# Patient Record
Sex: Female | Born: 1965
Health system: Southern US, Community
[De-identification: ages and names within clinical notes are randomized; demographics above are authoritative.]

## PROBLEM LIST (undated history)

## (undated) DIAGNOSIS — K219 Gastro-esophageal reflux disease without esophagitis: Secondary | ICD-10-CM

## (undated) DIAGNOSIS — I10 Essential (primary) hypertension: Secondary | ICD-10-CM

## (undated) HISTORY — PX: EYE SURGERY: SHX253

## (undated) HISTORY — DX: Essential (primary) hypertension: I10

## (undated) HISTORY — DX: Gastro-esophageal reflux disease without esophagitis: K21.9

---

## 2007-10-06 ENCOUNTER — Ambulatory Visit: Payer: Self-pay | Admitting: Obstetrics and Gynecology

## 2014-02-27 ENCOUNTER — Emergency Department (HOSPITAL_COMMUNITY)
Admission: EM | Admit: 2014-02-27 | Discharge: 2014-02-28 | Disposition: A | Discharge status: Home / Self Care | Payer: No Typology Code available for payment source | Attending: Emergency Medicine | Admitting: Emergency Medicine

## 2014-02-27 ENCOUNTER — Emergency Department (HOSPITAL_COMMUNITY): Payer: No Typology Code available for payment source

## 2014-02-27 ENCOUNTER — Encounter (HOSPITAL_COMMUNITY): Payer: Self-pay

## 2014-02-27 DIAGNOSIS — S63502A Unspecified sprain of left wrist, initial encounter: Secondary | ICD-10-CM | POA: Insufficient documentation

## 2014-02-27 DIAGNOSIS — Z23 Encounter for immunization: Secondary | ICD-10-CM | POA: Insufficient documentation

## 2014-02-27 DIAGNOSIS — S9031XA Contusion of right foot, initial encounter: Secondary | ICD-10-CM | POA: Insufficient documentation

## 2014-02-27 DIAGNOSIS — Y9389 Activity, other specified: Secondary | ICD-10-CM | POA: Diagnosis not present

## 2014-02-27 DIAGNOSIS — S63501A Unspecified sprain of right wrist, initial encounter: Secondary | ICD-10-CM | POA: Insufficient documentation

## 2014-02-27 DIAGNOSIS — Y998 Other external cause status: Secondary | ICD-10-CM | POA: Insufficient documentation

## 2014-02-27 DIAGNOSIS — Y92410 Unspecified street and highway as the place of occurrence of the external cause: Secondary | ICD-10-CM | POA: Insufficient documentation

## 2014-02-27 DIAGNOSIS — S81812A Laceration without foreign body, left lower leg, initial encounter: Secondary | ICD-10-CM | POA: Diagnosis present

## 2014-02-27 MED ORDER — TETANUS-DIPHTH-ACELL PERTUSSIS 5-2.5-18.5 LF-MCG/0.5 IM SUSP
INTRAMUSCULAR | Status: AC
  Filled 2014-02-27: qty 0.5

## 2014-02-27 MED ORDER — ONDANSETRON 4 MG PO TBDP
4.0000 mg | ORAL_TABLET | Freq: Once | ORAL | Status: AC
  Administered 2014-02-27: 4 mg via ORAL
  Filled 2014-02-27: qty 1

## 2014-02-27 MED ORDER — LIDOCAINE-EPINEPHRINE 1 %-1:100000 IJ SOLN
20.0000 mL | Freq: Once | INTRAMUSCULAR | Status: AC
  Administered 2014-02-28: 20 mL
  Filled 2014-02-27: qty 1

## 2014-02-27 MED ORDER — LIDOCAINE HCL (PF) 1 % IJ SOLN: 30.0000 mL | Freq: Once | INTRAMUSCULAR | Status: DC

## 2014-02-27 MED ORDER — TETANUS-DIPHTH-ACELL PERTUSSIS 5-2.5-18.5 LF-MCG/0.5 IM SUSP
0.5000 mL | Freq: Once | INTRAMUSCULAR | Status: AC
  Administered 2014-02-27: 0.5 mL via INTRAMUSCULAR

## 2014-02-27 MED ORDER — HYDROCODONE-ACETAMINOPHEN 5-325 MG PO TABS
2.0000 | ORAL_TABLET | Freq: Once | ORAL | Status: AC
  Administered 2014-02-27: 2 via ORAL
  Filled 2014-02-27: qty 2
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
## 2014-02-28 MED ORDER — IBUPROFEN 800 MG PO TABS: 800.0000 mg | ORAL_TABLET | Freq: Three times a day (TID) | ORAL | Status: DC

## 2014-02-28 MED ORDER — ORPHENADRINE CITRATE ER 100 MG PO TB12: 100.0000 mg | ORAL_TABLET | Freq: Two times a day (BID) | ORAL | Status: DC

## 2014-02-28 MED ORDER — HYDROCODONE-ACETAMINOPHEN 5-325 MG PO TABS: 1.0000 | ORAL_TABLET | ORAL | Status: DC | PRN
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
## 2014-02-28 NOTE — ED Notes (Signed)
Dr. Pfeiffer at bedside   

## 2014-03-15 ENCOUNTER — Ambulatory Visit: Payer: Self-pay | Admitting: Podiatry

## 2014-11-14 ENCOUNTER — Other Ambulatory Visit: Payer: Self-pay | Admitting: Physician Assistant

## 2014-11-14 DIAGNOSIS — R1310 Dysphagia, unspecified: Secondary | ICD-10-CM

## 2014-11-28 ENCOUNTER — Ambulatory Visit
Admission: RE | Admit: 2014-11-28 | Discharge: 2014-11-28 | Disposition: A | Discharge status: Home / Self Care | Payer: No Typology Code available for payment source | Source: Ambulatory Visit | Attending: Physician Assistant | Admitting: Physician Assistant

## 2014-11-28 DIAGNOSIS — R1310 Dysphagia, unspecified: Secondary | ICD-10-CM

## 2014-11-28 DIAGNOSIS — R1314 Dysphagia, pharyngoesophageal phase: Secondary | ICD-10-CM | POA: Diagnosis present

## 2014-11-28 DIAGNOSIS — K2980 Duodenitis without bleeding: Secondary | ICD-10-CM | POA: Insufficient documentation
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
## 2017-01-26 ENCOUNTER — Encounter: Payer: Self-pay | Admitting: Nurse Practitioner

## 2017-02-24 ENCOUNTER — Other Ambulatory Visit: Payer: Self-pay

## 2017-02-24 MED ORDER — PANTOPRAZOLE SODIUM 40 MG PO TBEC: 40.0000 mg | DELAYED_RELEASE_TABLET | Freq: Every day | ORAL | 1 refills | Status: DC

## 2017-03-26 ENCOUNTER — Other Ambulatory Visit: Payer: Self-pay

## 2017-03-26 MED ORDER — TRI-LO-MARZIA 0.18/0.215/0.25 MG-25 MCG PO TABS: ORAL_TABLET | ORAL | 1 refills | Status: DC

## 2017-04-22 ENCOUNTER — Telehealth: Payer: Self-pay

## 2017-04-23 NOTE — Telephone Encounter (Signed)
ERROR/CLC 

## 2017-05-14 ENCOUNTER — Other Ambulatory Visit: Payer: Self-pay

## 2017-05-14 MED ORDER — TRI-LO-MARZIA 0.18/0.215/0.25 MG-25 MCG PO TABS: ORAL_TABLET | ORAL | 0 refills | Status: DC

## 2017-06-11 ENCOUNTER — Ambulatory Visit: Payer: Self-pay | Admitting: Nurse Practitioner

## 2017-06-11 ENCOUNTER — Encounter: Payer: Self-pay | Admitting: Nurse Practitioner

## 2017-06-11 VITALS — BP 136/92 | HR 83 | Resp 16 | Ht 59.0 in | Wt 195.0 lb

## 2017-06-11 DIAGNOSIS — R300 Dysuria: Secondary | ICD-10-CM

## 2017-06-11 DIAGNOSIS — Z0001 Encounter for general adult medical examination with abnormal findings: Secondary | ICD-10-CM

## 2017-06-11 DIAGNOSIS — Z1231 Encounter for screening mammogram for malignant neoplasm of breast: Secondary | ICD-10-CM

## 2017-06-11 DIAGNOSIS — R635 Abnormal weight gain: Secondary | ICD-10-CM

## 2017-06-11 DIAGNOSIS — E559 Vitamin D deficiency, unspecified: Secondary | ICD-10-CM

## 2017-06-11 DIAGNOSIS — R51 Headache: Secondary | ICD-10-CM | POA: Diagnosis not present

## 2017-06-11 DIAGNOSIS — R519 Headache, unspecified: Secondary | ICD-10-CM

## 2017-06-11 DIAGNOSIS — Z1239 Encounter for other screening for malignant neoplasm of breast: Secondary | ICD-10-CM

## 2017-06-11 MED ORDER — IBUPROFEN 600 MG PO TABS: 600.0000 mg | ORAL_TABLET | Freq: Three times a day (TID) | ORAL | 1 refills | Status: DC | PRN
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
## 2017-06-12 LAB — URINALYSIS, ROUTINE W REFLEX MICROSCOPIC
Bilirubin, UA: NEGATIVE
Glucose, UA: NEGATIVE
LEUKOCYTES UA: NEGATIVE
Nitrite, UA: NEGATIVE
Protein, UA: NEGATIVE
RBC, UA: NEGATIVE
Specific Gravity, UA: >=1.03 — AB (ref 1.005–1.030)
Urobilinogen, Ur: 1 mg/dL (ref 0.2–1.0)
pH, UA: 6.5 (ref 5.0–7.5)

## 2017-06-17 LAB — CBC WITH DIFFERENTIAL/PLATELET
BASOS: 0 %
Basophils Absolute: 0 10*3/uL (ref 0.0–0.2)
EOS (ABSOLUTE): 0.3 10*3/uL (ref 0.0–0.4)
Eos: 4 %
HEMOGLOBIN: 12.8 g/dL (ref 11.1–15.9)
Hematocrit: 38.1 % (ref 34.0–46.6)
IMMATURE GRANS (ABS): 0 10*3/uL (ref 0.0–0.1)
IMMATURE GRANULOCYTES: 0 %
LYMPHS: 16 %
Lymphocytes Absolute: 1.2 10*3/uL (ref 0.7–3.1)
MCH: 30 pg (ref 26.6–33.0)
MCHC: 33.6 g/dL (ref 31.5–35.7)
MCV: 89 fL (ref 79–97)
Monocytes Absolute: 0.5 10*3/uL (ref 0.1–0.9)
Monocytes: 7 %
Neutrophils Absolute: 5.5 10*3/uL (ref 1.4–7.0)
Neutrophils: 73 %
Platelets: 275 10*3/uL (ref 150–379)
RBC: 4.27 x10E6/uL (ref 3.77–5.28)
RDW: 13.5 % (ref 12.3–15.4)
WBC: 7.6 10*3/uL (ref 3.4–10.8)

## 2017-06-17 LAB — COMPREHENSIVE METABOLIC PANEL
ALBUMIN: 4 g/dL (ref 3.5–5.5)
ALT: 22 IU/L (ref 0–32)
AST: 20 IU/L (ref 0–40)
Albumin/Globulin Ratio: 1.7 (ref 1.2–2.2)
Alkaline Phosphatase: 45 IU/L (ref 39–117)
BILIRUBIN TOTAL: 0.5 mg/dL (ref 0.0–1.2)
BUN / CREAT RATIO: 10 (ref 9–23)
BUN: 7 mg/dL (ref 6–24)
CALCIUM: 9.2 mg/dL (ref 8.7–10.2)
CO2: 23 mmol/L (ref 20–29)
Chloride: 106 mmol/L (ref 96–106)
Creatinine, Ser: 0.7 mg/dL (ref 0.57–1.00)
GFR, EST AFRICAN AMERICAN: 114 mL/min/{1.73_m2} (ref >59)
GFR, EST NON AFRICAN AMERICAN: 99 mL/min/{1.73_m2} (ref >59)
Globulin, Total: 2.3 g/dL (ref 1.5–4.5)
Glucose: 84 mg/dL (ref 65–99)
POTASSIUM: 4.5 mmol/L (ref 3.5–5.2)
SODIUM: 141 mmol/L (ref 134–144)
Total Protein: 6.3 g/dL (ref 6.0–8.5)

## 2017-06-17 LAB — VITAMIN D 1,25 DIHYDROXY
VITAMIN D 1, 25 (OH) TOTAL: 74 pg/mL — AB
Vitamin D2 1, 25 (OH)2: <10 pg/mL
Vitamin D3 1, 25 (OH)2: 74 pg/mL

## 2017-06-17 LAB — T4, FREE: Free T4: 1.29 ng/dL (ref 0.82–1.77)

## 2017-06-17 LAB — TSH: TSH: 1.44 u[IU]/mL (ref 0.450–4.500)

## 2017-06-17 LAB — LIPID PANEL
CHOL/HDL RATIO: 3.2 ratio (ref 0.0–4.4)
CHOLESTEROL TOTAL: 209 mg/dL — AB (ref 100–199)
HDL: 65 mg/dL (ref >39)
LDL CALC: 123 mg/dL — AB (ref 0–99)
Triglycerides: 106 mg/dL (ref 0–149)
VLDL CHOLESTEROL CAL: 21 mg/dL (ref 5–40)

## 2017-06-19 ENCOUNTER — Other Ambulatory Visit: Payer: Self-pay

## 2017-06-19 MED ORDER — TRI-LO-MARZIA 0.18/0.215/0.25 MG-25 MCG PO TABS: ORAL_TABLET | ORAL | 0 refills | Status: DC

## 2017-06-30 ENCOUNTER — Ambulatory Visit: Payer: PRIVATE HEALTH INSURANCE | Admitting: Nurse Practitioner

## 2017-06-30 ENCOUNTER — Encounter: Payer: Self-pay | Admitting: Nurse Practitioner

## 2017-06-30 VITALS — BP 144/86 | HR 74 | Resp 16 | Ht 59.0 in | Wt 193.0 lb

## 2017-06-30 DIAGNOSIS — R030 Elevated blood-pressure reading, without diagnosis of hypertension: Secondary | ICD-10-CM | POA: Diagnosis not present

## 2017-06-30 DIAGNOSIS — Z6838 Body mass index (BMI) 38.0-38.9, adult: Secondary | ICD-10-CM | POA: Diagnosis not present

## 2017-06-30 DIAGNOSIS — R635 Abnormal weight gain: Secondary | ICD-10-CM | POA: Diagnosis not present

## 2017-06-30 MED ORDER — PHENTERMINE HCL 37.5 MG PO TABS: 37.5000 mg | ORAL_TABLET | Freq: Every day | ORAL | 1 refills | Status: DC

## 2017-06-30 NOTE — Progress Notes (Signed)
[facility] [address] [place], [place] [address]  Internal MEDICINE  Office Visit Note  Patient Name: Lauren Marsh  [id number]  [id number]  Date of Service: 06/30/2017   Pt is here for routine follow up  Chief Complaint  Patient presents with  . Weight Loss  . Follow-up    labs     The patient would like to start medically managed weight loss program. Labs were drawn and thyroid panel was normal. She has started exercising. She is waling 15 minutes twice daily, Walking about 1 mile per day. She is also watching and carefully monitoring her diet. Has never been on appetite suppressant.    .    Current Medication: Outpatient Encounter Medications as of 06/30/2017  Medication Sig  . acetaminophen (TYLENOL) 500 MG tablet Take by mouth.  Marland Kitchen ibuprofen (ADVIL,MOTRIN) 200 MG tablet Take by mouth.  Marland Kitchen ibuprofen (ADVIL,MOTRIN) 600 MG tablet Take 1 tablet (600 mg total) by mouth every 8 (eight) hours as needed for headache.  . naproxen sodium (ANAPROX) 550 MG tablet Take 550 mg by mouth 2 (two) times daily with a meal.  . pantoprazole (PROTONIX) 40 MG tablet Take 1 tablet (40 mg total) by mouth daily. Take 1 tablet by mouth daily every morning  . phentermine (ADIPEX-P) 37.5 MG tablet Take 1 tablet (37.5 mg total) by mouth daily before breakfast.  . TRI-LO-MARZIA 0.18/0.215/0.25 MG-25 MCG tab Use as directed for 28 days   No facility-administered encounter medications on file as of 06/30/2017.     Surgical History: Past Surgical History:  Procedure Laterality Date  . EYE SURGERY      Medical History: Past Medical History:  Diagnosis Date  . GERD (gastroesophageal reflux disease)     Family History: Family History  Problem Relation Age of Onset  . Hypertension Mother   . Hypertension Father     Social History   Socioeconomic History  . Marital status: Married    Spouse name: Not on file  . Number of children: Not on file  . Years of education: Not  on file  . Highest education level: Not on file  Occupational History  . Not on file  Social Needs  . Financial resource strain: Not on file  . Food insecurity:    Worry: Not on file    Inability: Not on file  . Transportation needs:    Medical: Not on file    Non-medical: Not on file  Tobacco Use  . Smoking status: Never Smoker  . Smokeless tobacco: Never Used  Substance and Sexual Activity  . Alcohol use: No  . Drug use: Never  . Sexual activity: Not on file  Lifestyle  . Physical activity:    Days per week: Not on file    Minutes per session: Not on file  . Stress: Not on file  Relationships  . Social connections:    Talks on phone: Not on file    Gets together: Not on file    Attends religious service: Not on file    Active member of club or organization: Not on file    Attends meetings of clubs or organizations: Not on file    Relationship status: Not on file  . Intimate partner violence:    Fear of current or ex partner: Not on file    Emotionally abused: Not on file    Physically abused: Not on file    Forced sexual activity: Not on file  Other Topics Concern  .  Not on file  Social History Narrative  . Not on file      Review of Systems  Constitutional: Negative for activity change, chills, fatigue and unexpected weight change.       Weight loss of  2 pounds since msot recent visit.   HENT: Negative for congestion, postnasal drip, rhinorrhea, sneezing and sore throat.   Eyes: Negative.  Negative for redness.  Respiratory: Negative for cough, chest tightness, shortness of breath and wheezing.   Cardiovascular: Negative for chest pain and palpitations.  Gastrointestinal: Negative for abdominal pain, constipation, diarrhea, nausea and vomiting.  Endocrine: Negative for cold intolerance, heat intolerance, polydipsia, polyphagia and polyuria.  Genitourinary: Negative for dysuria and frequency.  Musculoskeletal: Negative for arthralgias, back pain, joint  swelling and neck pain.  Skin: Negative for rash.  Allergic/Immunologic: Negative for environmental allergies.  Neurological: Negative.  Negative for tremors and numbness.  Hematological: Negative for adenopathy. Does not bruise/bleed easily.  Psychiatric/Behavioral: Negative for behavioral problems (Depression), sleep disturbance and suicidal ideas. The patient is not nervous/anxious.     Today's Vitals   06/30/17 1401  BP: (!) 144/86  Pulse: 74  Resp: 16  SpO2: 98%  Weight: 193 lb (87.5 kg)  Height:  (1.499 m)    Physical Exam  Constitutional: She is oriented to person, place, and time. She appears well-developed and well-nourished. No distress.  HENT:  Head: Normocephalic and atraumatic.  Mouth/Throat: Oropharynx is clear and moist. No oropharyngeal exudate.  Eyes: Pupils are equal, round, and reactive to light. Conjunctivae and EOM are normal.  Neck: Normal range of motion. Neck supple. No JVD present. No tracheal deviation present. No thyromegaly present.  Cardiovascular: Normal rate, regular rhythm and normal heart sounds. Exam reveals no gallop and no friction rub.  No murmur heard. Pulmonary/Chest: Effort normal and breath sounds normal. No respiratory distress. She has no wheezes. She has no rales. She exhibits no tenderness.  Abdominal: Soft. Bowel sounds are normal. There is no tenderness.  Musculoskeletal: Normal range of motion.  Lymphadenopathy:    She has no cervical adenopathy.  Neurological: She is alert and oriented to person, place, and time. No cranial nerve deficit.  Skin: Skin is warm and dry. She is not diaphoretic.  Psychiatric: She has a normal mood and affect. Her behavior is normal. Judgment and thought content normal.  Nursing note and vitals reviewed.   Assessment/Plan: 1. Body mass index (bmi) 38.0-38.9, adult - Metabolic Test indicates slightly slower metabolism than normal. Start phentermine every day. Sample menus for 1500 and 1600  calorie diets were given continue with increased exercise.  - phentermine (ADIPEX-P) 37.5 MG tablet; Take 1 tablet (37.5 mg total) by mouth daily before breakfast.  Dispense: 30 tablet; Refill: 1  2. Abnormal weight gain Reviewed labs which were normal. Weight loss program as described. Follow up 4 weeks.   3. Elevated blood pressure reading Increase water and reduce intake of salt. Increase exercise. Continue to monitor closely   General Counseling: kumiko fishman understanding of the findings of todays visit and agrees with plan of treatment. I have discussed any further diagnostic evaluation that may be needed or ordered today. We also reviewed her medications today. she has been encouraged to call the office with any questions or concerns that should arise related to todays visit.   There is a liability release in patients' chart. There has been a 10 minute discussion about the side effects including but not limited to elevated blood pressure, anxiety, lack of  sleep and dry mouth. Pt understands and will like to start/continue on appetite suppressant at this time. There will be one month RX given at the time of visit with proper follow up. Nova diet plan with restricted calories is given to the pt. Pt understands and agrees with  plan of treatment  This patient was seen by Vincent Gros, FNP- C in Collaboration with Dr Lyndon Code as a part of collaborative care agreement    Orders Placed This Encounter  Procedures  . Metabolic Test    Meds ordered this encounter  Medications  . phentermine (ADIPEX-P) 37.5 MG tablet    Sig: Take 1 tablet (37.5 mg total) by mouth daily before breakfast.    Dispense:  30 tablet    Refill:  1    Order Specific Question:   Supervising Provider    Answer:   Lyndon Code [[id number]]    Time spent: 31 Minutes     Dr Lyndon Code Internal medicine

## 2017-07-06 DIAGNOSIS — R519 Headache, unspecified: Secondary | ICD-10-CM | POA: Insufficient documentation

## 2017-07-06 DIAGNOSIS — Z0001 Encounter for general adult medical examination with abnormal findings: Secondary | ICD-10-CM | POA: Insufficient documentation

## 2017-07-06 DIAGNOSIS — R300 Dysuria: Secondary | ICD-10-CM | POA: Insufficient documentation

## 2017-07-06 DIAGNOSIS — R51 Headache: Secondary | ICD-10-CM

## 2017-07-06 DIAGNOSIS — Z1239 Encounter for other screening for malignant neoplasm of breast: Secondary | ICD-10-CM

## 2017-07-06 DIAGNOSIS — E559 Vitamin D deficiency, unspecified: Secondary | ICD-10-CM | POA: Insufficient documentation

## 2017-07-13 ENCOUNTER — Other Ambulatory Visit: Payer: Self-pay | Admitting: Nurse Practitioner

## 2017-07-13 MED ORDER — TRI-LO-MARZIA 0.18/0.215/0.25 MG-25 MCG PO TABS: ORAL_TABLET | ORAL | 12 refills | Status: DC

## 2017-07-28 ENCOUNTER — Encounter: Payer: Self-pay | Admitting: Nurse Practitioner

## 2017-07-28 ENCOUNTER — Ambulatory Visit: Payer: PRIVATE HEALTH INSURANCE | Admitting: Nurse Practitioner

## 2017-07-28 VITALS — BP 138/80 | HR 111 | Resp 16 | Ht 59.0 in | Wt 187.8 lb

## 2017-07-28 DIAGNOSIS — Z6838 Body mass index (BMI) 38.0-38.9, adult: Secondary | ICD-10-CM

## 2017-07-28 DIAGNOSIS — R030 Elevated blood-pressure reading, without diagnosis of hypertension: Secondary | ICD-10-CM | POA: Diagnosis not present

## 2017-07-28 MED ORDER — PHENTERMINE HCL 37.5 MG PO TABS: 37.5000 mg | ORAL_TABLET | Freq: Every day | ORAL | 1 refills | Status: DC

## 2017-07-28 NOTE — Progress Notes (Signed)
[facility] [address] [place], [place] [address]  Internal MEDICINE  Office Visit Note  Patient Name: Lauren Marsh  [id number]  [id number]  Date of Service: 08/19/2017     Pt is here for routine follow up.   Chief Complaint  Patient presents with  . Weight Loss    follow up - 6 pound weight loss since her most recent visit.     Patient has been on phentermine for past 6 weeks. Is doing well. Has lost 6 pounds since most recent visit. No negative side effects reported. Walking 2 miles per day, 5 miles per day. Increased energy. Definitely decreased appetite. Feels good.     Current Medication: Outpatient Encounter Medications as of 07/28/2017  Medication Sig  . acetaminophen (TYLENOL) 500 MG tablet Take by mouth.  Marland Kitchen. ibuprofen (ADVIL,MOTRIN) 200 MG tablet Take by mouth.  . naproxen sodium (ANAPROX) 550 MG tablet Take 550 mg by mouth 2 (two) times daily with a meal.  . phentermine (ADIPEX-P) 37.5 MG tablet Take 1 tablet (37.5 mg total) by mouth daily before breakfast.  . TRI-LO-MARZIA 0.18/0.215/0.25 MG-25 MCG tab Use as directed for 28 days  . [DISCONTINUED] ibuprofen (ADVIL,MOTRIN) 600 MG tablet Take 1 tablet (600 mg total) by mouth every 8 (eight) hours as needed for headache.  . [DISCONTINUED] pantoprazole (PROTONIX) 40 MG tablet Take 1 tablet (40 mg total) by mouth daily. Take 1 tablet by mouth daily every morning  . [DISCONTINUED] phentermine (ADIPEX-P) 37.5 MG tablet Take 1 tablet (37.5 mg total) by mouth daily before breakfast.   No facility-administered encounter medications on file as of 07/28/2017.     Surgical History: Past Surgical History:  Procedure Laterality Date  . EYE SURGERY      Medical History: Past Medical History:  Diagnosis Date  . GERD (gastroesophageal reflux disease)     Family History: Family History  Problem Relation Age of Onset  . Hypertension Mother   . Hypertension Father     Social History   Socioeconomic  History  . Marital status: Married    Spouse name: Not on file  . Number of children: Not on file  . Years of education: Not on file  . Highest education level: Not on file  Occupational History  . Not on file  Social Needs  . Financial resource strain: Not on file  . Food insecurity:    Worry: Not on file    Inability: Not on file  . Transportation needs:    Medical: Not on file    Non-medical: Not on file  Tobacco Use  . Smoking status: Never Smoker  . Smokeless tobacco: Never Used  Substance and Sexual Activity  . Alcohol use: No  . Drug use: Never  . Sexual activity: Not on file  Lifestyle  . Physical activity:    Days per week: Not on file    Minutes per session: Not on file  . Stress: Not on file  Relationships  . Social connections:    Talks on phone: Not on file    Gets together: Not on file    Attends religious service: Not on file    Active member of club or organization: Not on file    Attends meetings of clubs or organizations: Not on file    Relationship status: Not on file  . Intimate partner violence:    Fear of current or ex partner: Not on file    Emotionally abused: Not on file  Physically abused: Not on file    Forced sexual activity: Not on file  Other Topics Concern  . Not on file  Social History Narrative  . Not on file      Review of Systems  Constitutional: Negative for activity change, chills, fatigue and unexpected weight change.       Weight loss of 6 pounds since msot recent visit.   HENT: Negative for congestion, postnasal drip, rhinorrhea, sneezing and sore throat.   Eyes: Negative.  Negative for redness.  Respiratory: Negative for cough, chest tightness, shortness of breath and wheezing.   Cardiovascular: Negative for chest pain and palpitations.  Gastrointestinal: Negative for abdominal pain, constipation, diarrhea, nausea and vomiting.  Endocrine: Negative for cold intolerance, heat intolerance, polydipsia, polyphagia and  polyuria.  Genitourinary: Negative for dysuria and frequency.  Musculoskeletal: Negative for arthralgias, back pain, joint swelling and neck pain.  Skin: Negative for rash.  Allergic/Immunologic: Negative for environmental allergies.  Neurological: Negative for dizziness, tremors, numbness and headaches.  Hematological: Negative for adenopathy. Does not bruise/bleed easily.  Psychiatric/Behavioral: Negative for behavioral problems (Depression), sleep disturbance and suicidal ideas. The patient is not nervous/anxious.     Today's Vitals   07/28/17 1551  BP: 138/80  Pulse: (!) 111  Resp: 16  SpO2: 95%  Weight: 187 lb 12.8 oz (85.2 kg)  Height: 4\' 11"  (1.499 m)    Physical Exam  Constitutional: She is oriented to person, place, and time. She appears well-developed and well-nourished. No distress.  HENT:  Head: Normocephalic and atraumatic.  Mouth/Throat: Oropharynx is clear and moist. No oropharyngeal exudate.  Eyes: Pupils are equal, round, and reactive to light. Conjunctivae and EOM are normal.  Neck: Normal range of motion. Neck supple. No JVD present. No tracheal deviation present. No thyromegaly present.  Cardiovascular: Normal rate, regular rhythm and normal heart sounds. Exam reveals no gallop and no friction rub.  No murmur heard. Pulmonary/Chest: Effort normal and breath sounds normal. No respiratory distress. She has no wheezes. She has no rales. She exhibits no tenderness.  Abdominal: Soft. Bowel sounds are normal. There is no tenderness.  Musculoskeletal: Normal range of motion.  Lymphadenopathy:    She has no cervical adenopathy.  Neurological: She is alert and oriented to person, place, and time. No cranial nerve deficit.  Skin: Skin is warm and dry. She is not diaphoretic.  Psychiatric: She has a normal mood and affect. Her behavior is normal. Judgment and thought content normal.  Nursing note and vitals reviewed.   Assessment/Plan: 1. Elevated blood pressure  reading bp remains stable. Continue to control through diet and exercise   2. Body mass index (bmi) 38.0-38.9, adult Improving. Continue phentermine daiy. Limit calorie intake to 1200-1500 calories per day. Continue to participate in routine cardiovascular exercise.  - phentermine (ADIPEX-P) 37.5 MG tablet; Take 1 tablet (37.5 mg total) by mouth daily before breakfast.  Dispense: 30 tablet; Refill: 1  General Counseling: manuella blackson understanding of the findings of todays visit and agrees with plan of treatment. I have discussed any further diagnostic evaluation that may be needed or ordered today. We also reviewed her medications today. she has been encouraged to call the office with any questions or concerns that should arise related to todays visit.    Counseling:   There is a liability release in patients' chart. There has been a 10 minute discussion about the side effects including but not limited to elevated blood pressure, anxiety, lack of sleep and dry mouth. Pt understands  and will like to start/continue on appetite suppressant at this time. There will be one month RX given at the time of visit with proper follow up. Nova diet plan with restricted calories is given to the pt. Pt understands and agrees with  plan of treatment  This patient was seen by Vincent Gros, FNP- C in Collaboration with Dr Lyndon Code as a part of collaborative care agreement  Meds ordered this encounter  Medications  . phentermine (ADIPEX-P) 37.5 MG tablet    Sig: Take 1 tablet (37.5 mg total) by mouth daily before breakfast.    Dispense:  30 tablet    Refill:  1    Order Specific Question:   Supervising Provider    Answer:   Lyndon Code [[id number]]    Time spent: 68 Minutes          Dr Lyndon Code Internal medicine

## 2017-07-31 ENCOUNTER — Other Ambulatory Visit: Payer: Self-pay

## 2017-07-31 MED ORDER — PANTOPRAZOLE SODIUM 40 MG PO TBEC: 40.0000 mg | DELAYED_RELEASE_TABLET | Freq: Every day | ORAL | 1 refills | Status: DC

## 2017-08-10 ENCOUNTER — Other Ambulatory Visit: Payer: Self-pay | Admitting: Nurse Practitioner

## 2017-08-10 DIAGNOSIS — R51 Headache: Principal | ICD-10-CM

## 2017-08-10 DIAGNOSIS — R519 Headache, unspecified: Secondary | ICD-10-CM

## 2017-08-10 MED ORDER — IBUPROFEN 600 MG PO TABS: 600.0000 mg | ORAL_TABLET | Freq: Three times a day (TID) | ORAL | 1 refills | Status: DC | PRN

## 2017-09-04 ENCOUNTER — Encounter: Payer: Self-pay | Admitting: Adult Health

## 2017-09-04 ENCOUNTER — Other Ambulatory Visit: Payer: Self-pay

## 2017-09-04 ENCOUNTER — Ambulatory Visit: Payer: PRIVATE HEALTH INSURANCE | Admitting: Adult Health

## 2017-09-04 VITALS — BP 167/100 | HR 103 | Resp 16 | Ht 59.0 in | Wt 182.0 lb

## 2017-09-04 DIAGNOSIS — R030 Elevated blood-pressure reading, without diagnosis of hypertension: Secondary | ICD-10-CM

## 2017-09-04 DIAGNOSIS — Z6838 Body mass index (BMI) 38.0-38.9, adult: Secondary | ICD-10-CM

## 2017-09-04 MED ORDER — AMLODIPINE BESYLATE 5 MG PO TABS: 5.0000 mg | ORAL_TABLET | Freq: Every day | ORAL | 3 refills | Status: DC

## 2017-09-04 MED ORDER — AMLODIPINE BESYLATE 5 MG PO TABS: 5.0000 mg | ORAL_TABLET | Freq: Every day | ORAL | 0 refills | Status: DC

## 2017-09-04 NOTE — Progress Notes (Signed)
[facility] [address] [place], [place] [address]  Internal MEDICINE  Office Visit Note  Patient Name: Lauren Marsh  [id number] 02, 2067  [id number]  Date of Service: 09/04/2017  Chief Complaint  Patient presents with  . Medical Management of Chronic Issues    weight loss    HPI Pt here for follow up for weight loss medication.  Her blood pressure has been elevated at recent visits.  Today she continues to be elevated, and has a significant family history of HTN. She denies headaches, palpitation or chest pain.      Current Medication: Outpatient Encounter Medications as of 09/04/2017  Medication Sig  . acetaminophen (TYLENOL) 500 MG tablet Take by mouth.  Marland Kitchen. ibuprofen (ADVIL,MOTRIN) 200 MG tablet Take by mouth.  Marland Kitchen. ibuprofen (ADVIL,MOTRIN) 600 MG tablet Take 1 tablet (600 mg total) by mouth every 8 (eight) hours as needed for headache.  . naproxen sodium (ANAPROX) 550 MG tablet Take 550 mg by mouth 2 (two) times daily with a meal.  . pantoprazole (PROTONIX) 40 MG tablet Take 1 tablet (40 mg total) by mouth daily. Take 1 tablet by mouth daily every morning  . phentermine (ADIPEX-P) 37.5 MG tablet Take 1 tablet (37.5 mg total) by mouth daily before breakfast.  . TRI-LO-MARZIA 0.18/0.215/0.25 MG-25 MCG tab Use as directed for 28 days   No facility-administered encounter medications on file as of 09/04/2017.     Surgical History: Past Surgical History:  Procedure Laterality Date  . EYE SURGERY      Medical History: Past Medical History:  Diagnosis Date  . GERD (gastroesophageal reflux disease)     Family History: Family History  Problem Relation Age of Onset  . Hypertension Mother   . Hypertension Father     Social History   Socioeconomic History  . Marital status: Married    Spouse name: Not on file  . Number of children: Not on file  . Years of education: Not on file  . Highest education level: Not on file  Occupational History  . Not on file   Social Needs  . Financial resource strain: Not on file  . Food insecurity:    Worry: Not on file    Inability: Not on file  . Transportation needs:    Medical: Not on file    Non-medical: Not on file  Tobacco Use  . Smoking status: Never Smoker  . Smokeless tobacco: Never Used  Substance and Sexual Activity  . Alcohol use: No  . Drug use: Never  . Sexual activity: Not on file  Lifestyle  . Physical activity:    Days per week: Not on file    Minutes per session: Not on file  . Stress: Not on file  Relationships  . Social connections:    Talks on phone: Not on file    Gets together: Not on file    Attends religious service: Not on file    Active member of club or organization: Not on file    Attends meetings of clubs or organizations: Not on file    Relationship status: Not on file  . Intimate partner violence:    Fear of current or ex partner: Not on file    Emotionally abused: Not on file    Physically abused: Not on file    Forced sexual activity: Not on file  Other Topics Concern  . Not on file  Social History Narrative  . Not on file      Review  of Systems  Constitutional: Negative for chills, fatigue and unexpected weight change.  HENT: Negative for congestion, rhinorrhea, sneezing and sore throat.   Eyes: Negative for photophobia, pain and redness.  Respiratory: Negative for cough, chest tightness and shortness of breath.   Cardiovascular: Negative for chest pain and palpitations.  Gastrointestinal: Negative for abdominal pain, constipation, diarrhea, nausea and vomiting.  Endocrine: Negative.   Genitourinary: Negative for dysuria and frequency.  Musculoskeletal: Negative for arthralgias, back pain, joint swelling and neck pain.  Skin: Negative for rash.  Allergic/Immunologic: Negative.   Neurological: Negative for tremors and numbness.  Hematological: Negative for adenopathy. Does not bruise/bleed easily.  Psychiatric/Behavioral: Negative for behavioral  problems and sleep disturbance. The patient is not nervous/anxious.     Vital Signs: BP (!) 167/100   Pulse (!) 103   Resp 16   Ht 4\' 11"  (1.499 m)   Wt 182 lb (82.6 kg)   SpO2 98%   BMI 36.76 kg/m    Physical Exam  Constitutional: She is oriented to person, place, and time. She appears well-developed and well-nourished. No distress.  HENT:  Head: Normocephalic and atraumatic.  Mouth/Throat: Oropharynx is clear and moist. No oropharyngeal exudate.  Eyes: Pupils are equal, round, and reactive to light. EOM are normal.  Neck: Normal range of motion. Neck supple. No JVD present. No tracheal deviation present. No thyromegaly present.  Cardiovascular: Normal rate, regular rhythm and normal heart sounds. Exam reveals no gallop and no friction rub.  No murmur heard. Pulmonary/Chest: Effort normal and breath sounds normal. No respiratory distress. She has no wheezes. She has no rales. She exhibits no tenderness.  Abdominal: Soft. There is no tenderness. There is no guarding.  Musculoskeletal: Normal range of motion.  Lymphadenopathy:    She has no cervical adenopathy.  Neurological: She is alert and oriented to person, place, and time. No cranial nerve deficit.  Skin: Skin is warm and dry. She is not diaphoretic.  Psychiatric: She has a normal mood and affect. Her behavior is normal. Judgment and thought content normal.  Nursing note and vitals reviewed.  Assessment/Plan: 1. Elevated blood pressure reading Take Norvasc as discussed.  Keep log of BP daily and bring with you to next visit.    - amLODipine (NORVASC) 5 MG tablet; Take 1 tablet (5 mg total) by mouth daily.  Dispense: 30 tablet; Refill: 0  2. Body mass index (bmi) 38.0-38.9, adult Decrease salt and sodium intake.  Drink plenty of fluids.  Obesity Counseling: Risk Assessment: An assessment of behavioral risk factors was made today and includes lack of exercise sedentary lifestyle, lack of portion control and poor dietary  habits.  Risk Modification Advice: She was counseled on portion control guidelines. Restricting daily caloric intake to. . The detrimental long term effects of obesity on her health and ongoing poor compliance was also discussed with the patient.    General Counseling: mariem skolnick understanding of the findings of todays visit and agrees with plan of treatment. I have discussed any further diagnostic evaluation that may be needed or ordered today. We also reviewed her medications today. she has been encouraged to call the office with any questions or concerns that should arise related to todays visit.    No orders of the defined types were placed in this encounter.   No orders of the defined types were placed in this encounter.   Time spent: 25 Minutes   This patient was seen by Blima Ledger AGNP-C in Collaboration with Dr Shannan Harper  Welton Flakes as a part of collaborative care agreement    Dr Lyndon Code Internal medicine

## 2017-09-04 NOTE — Patient Instructions (Signed)

## 2017-09-07 ENCOUNTER — Other Ambulatory Visit: Payer: Self-pay

## 2017-09-07 DIAGNOSIS — R030 Elevated blood-pressure reading, without diagnosis of hypertension: Secondary | ICD-10-CM

## 2017-09-07 MED ORDER — AMLODIPINE BESYLATE 5 MG PO TABS: 5.0000 mg | ORAL_TABLET | Freq: Every day | ORAL | 3 refills | Status: DC

## 2017-09-14 ENCOUNTER — Encounter: Payer: Self-pay | Admitting: Adult Health

## 2017-10-05 ENCOUNTER — Ambulatory Visit: Payer: Self-pay | Admitting: Adult Health

## 2017-10-13 ENCOUNTER — Ambulatory Visit: Payer: PRIVATE HEALTH INSURANCE | Admitting: Nurse Practitioner

## 2017-10-13 ENCOUNTER — Encounter: Payer: Self-pay | Admitting: Nurse Practitioner

## 2017-10-13 VITALS — BP 143/82 | HR 78 | Resp 16 | Ht 59.0 in | Wt 178.8 lb

## 2017-10-13 DIAGNOSIS — Z6838 Body mass index (BMI) 38.0-38.9, adult: Secondary | ICD-10-CM

## 2017-10-13 DIAGNOSIS — I10 Essential (primary) hypertension: Secondary | ICD-10-CM

## 2017-10-13 MED ORDER — PHENTERMINE HCL 37.5 MG PO TABS: 37.5000 mg | ORAL_TABLET | Freq: Every day | ORAL | 1 refills | Status: DC

## 2017-10-13 NOTE — Progress Notes (Signed)
[facility] [address] [place], [place] [address]  Internal MEDICINE  Office Visit Note  Patient Name: Lauren Marsh  [id number]  [id number]  Date of Service: 10/21/2017  Chief Complaint  Patient presents with  . Medical Management of Chronic Issues    4wk follow up weight management. pt stated the she has not been sleeping well it could be from the phentermine. she takes it at 6am but when she lay down to sleep she sleep a few hours and back up wide awake also hasnt been taking the medication daily due to her blood pressure being high  . Hypertension    improved blood pressure since starting amlodipine.    Not taking phentermine every day. Will go two to three days without taking it. Found it was interfering with her sleep. Would wake up in the middle of the night and not be able to go back to sleep. Sleep has improved with reduced dosing of phentermine. Has had a four pound weight loss since her most recent visit.   Hypertension  This is a chronic problem. The current episode started more than 1 month ago. The problem has been gradually improving since onset. The problem is controlled. Pertinent negatives include no chest pain, headaches, neck pain, palpitations or shortness of breath. Associated agents: stimulant appetite suppressant. Risk factors for coronary artery disease include obesity and family history. Past treatments include calcium channel blockers. The current treatment provides moderate improvement. There are no compliance problems.        Current Medication: Outpatient Encounter Medications as of 10/13/2017  Medication Sig  . acetaminophen (TYLENOL) 500 MG tablet Take by mouth.  Marland Kitchen amLODipine (NORVASC) 5 MG tablet Take 1 tablet (5 mg total) by mouth daily.  Marland Kitchen ibuprofen (ADVIL,MOTRIN) 200 MG tablet Take by mouth.  Marland Kitchen ibuprofen (ADVIL,MOTRIN) 600 MG tablet Take 1 tablet (600 mg total) by mouth every 8 (eight) hours as needed for headache.  . naproxen  sodium (ANAPROX) 550 MG tablet Take 550 mg by mouth 2 (two) times daily with a meal.  . pantoprazole (PROTONIX) 40 MG tablet Take 1 tablet (40 mg total) by mouth daily. Take 1 tablet by mouth daily every morning  . phentermine (ADIPEX-P) 37.5 MG tablet Take 1 tablet (37.5 mg total) by mouth daily before breakfast.  . TRI-LO-MARZIA 0.18/0.215/0.25 MG-25 MCG tab Use as directed for 28 days  . [DISCONTINUED] phentermine (ADIPEX-P) 37.5 MG tablet Take 1 tablet (37.5 mg total) by mouth daily before breakfast.   No facility-administered encounter medications on file as of 10/13/2017.     Surgical History: Past Surgical History:  Procedure Laterality Date  . EYE SURGERY      Medical History: Past Medical History:  Diagnosis Date  . GERD (gastroesophageal reflux disease)     Family History: Family History  Problem Relation Age of Onset  . Hypertension Mother   . Hypertension Father     Social History   Socioeconomic History  . Marital status: Married    Spouse name: Not on file  . Number of children: Not on file  . Years of education: Not on file  . Highest education level: Not on file  Occupational History  . Not on file  Social Needs  . Financial resource strain: Not on file  . Food insecurity:    Worry: Not on file    Inability: Not on file  . Transportation needs:    Medical: Not on file    Non-medical: Not on file  Tobacco Use  . Smoking status: Never Smoker  . Smokeless tobacco: Never Used  Substance and Sexual Activity  . Alcohol use: Yes    Comment: occasionally  . Drug use: Never  . Sexual activity: Not on file  Lifestyle  . Physical activity:    Days per week: Not on file    Minutes per session: Not on file  . Stress: Not on file  Relationships  . Social connections:    Talks on phone: Not on file    Gets together: Not on file    Attends religious service: Not on file    Active member of club or organization: Not on file    Attends meetings of clubs or  organizations: Not on file    Relationship status: Not on file  . Intimate partner violence:    Fear of current or ex partner: Not on file    Emotionally abused: Not on file    Physically abused: Not on file    Forced sexual activity: Not on file  Other Topics Concern  . Not on file  Social History Narrative  . Not on file      Review of Systems  Constitutional: Negative for activity change, chills, fatigue and unexpected weight change.       Weight loss of 4 pounds since msot recent visit.   HENT: Negative for congestion, postnasal drip, rhinorrhea, sneezing and sore throat.   Eyes: Negative.  Negative for redness.  Respiratory: Negative for cough, chest tightness, shortness of breath and wheezing.   Cardiovascular: Negative for chest pain and palpitations.       Improved blood pressure.  Gastrointestinal: Negative for abdominal pain, constipation, diarrhea, nausea and vomiting.  Endocrine: Negative for cold intolerance, heat intolerance, polydipsia, polyphagia and polyuria.  Genitourinary: Negative for dysuria and frequency.  Musculoskeletal: Negative for arthralgias, back pain, joint swelling and neck pain.  Skin: Negative for rash.  Allergic/Immunologic: Negative for environmental allergies.  Neurological: Negative for dizziness, tremors, numbness and headaches.  Hematological: Negative for adenopathy. Does not bruise/bleed easily.  Psychiatric/Behavioral: Negative for behavioral problems (Depression), sleep disturbance and suicidal ideas. The patient is not nervous/anxious.     Today's Vitals   10/13/17 1619  BP: (!) 143/82  Pulse: 78  Resp: 16  SpO2: 99%  Weight: 178 lb 12.8 oz (81.1 kg)  Height: 4\' 11"  (1.499 m)    Physical Exam  Constitutional: She is oriented to person, place, and time. She appears well-developed and well-nourished. No distress.  HENT:  Head: Normocephalic and atraumatic.  Nose: Nose normal.  Mouth/Throat: Oropharynx is clear and moist. No  oropharyngeal exudate.  Eyes: Pupils are equal, round, and reactive to light. Conjunctivae and EOM are normal.  Neck: Normal range of motion. Neck supple. No JVD present. No tracheal deviation present. No thyromegaly present.  Cardiovascular: Normal rate, regular rhythm and normal heart sounds. Exam reveals no gallop and no friction rub.  No murmur heard. Pulmonary/Chest: Effort normal and breath sounds normal. No respiratory distress. She has no wheezes. She has no rales. She exhibits no tenderness.  Abdominal: Soft. Bowel sounds are normal. There is no tenderness.  Musculoskeletal: Normal range of motion.  Lymphadenopathy:    She has no cervical adenopathy.  Neurological: She is alert and oriented to person, place, and time. No cranial nerve deficit.  Skin: Skin is warm and dry. She is not diaphoretic.  Psychiatric: She has a normal mood and affect. Her behavior is normal. Judgment and thought content normal.  Nursing note and vitals reviewed.  Assessment/Plan:  1. Essential hypertension Improved. Continue amlodipine as prescribed. Continue with plan for weight loss.   2. Body mass index (bmi) 38.0-38.9, adult Improved. May continue phentermine 37.5mg  tablets. Limit calorie intake to 1500 calories per day. Advance exercise routine as tolerated.  - phentermine (ADIPEX-P) 37.5 MG tablet; Take 1 tablet (37.5 mg total) by mouth daily before breakfast.  Dispense: 30 tablet; Refill: 1  General Counseling: anwyn pila understanding of the findings of todays visit and agrees with plan of treatment. I have discussed any further diagnostic evaluation that may be needed or ordered today. We also reviewed her medications today. she has been encouraged to call the office with any questions or concerns that should arise related to todays visit.  Hypertension Counseling:   The following hypertensive lifestyle modification were recommended and discussed:  1. Limiting alcohol intake to less than  1 oz/day of ethanol:(24 oz of beer or 8 oz of wine or 2 oz of 100-proof whiskey). 2. Take baby ASA 81 mg daily. 3. Importance of regular aerobic exercise and losing weight. 4. Reduce dietary saturated fat and cholesterol intake for overall cardiovascular health. 5. Maintaining adequate dietary potassium, calcium, and magnesium intake. 6. Regular monitoring of the blood pressure. 7. Reduce sodium intake to less than 100 mmol/day (less than 2.3 gm of sodium or less than 6 gm of sodium choride)    There is a liability release in patients' chart. There has been a 10 minute discussion about the side effects including but not limited to elevated blood pressure, anxiety, lack of sleep and dry mouth. Pt understands and will like to start/continue on appetite suppressant at this time. There will be one month RX given at the time of visit with proper follow up. Nova diet plan with restricted calories is given to the pt. Pt understands and agrees with  plan of treatment    Meds ordered this encounter  Medications  . phentermine (ADIPEX-P) 37.5 MG tablet    Sig: Take 1 tablet (37.5 mg total) by mouth daily before breakfast.    Dispense:  30 tablet    Refill:  1    Order Specific Question:   Supervising Provider    Answer:   Lyndon Code [[id number]]    Time spent: 74 Minutes      Dr Lyndon Code Internal medicine

## 2017-10-21 DIAGNOSIS — I10 Essential (primary) hypertension: Secondary | ICD-10-CM | POA: Insufficient documentation

## 2017-10-22 ENCOUNTER — Other Ambulatory Visit: Payer: Self-pay

## 2017-10-22 DIAGNOSIS — R51 Headache: Principal | ICD-10-CM

## 2017-10-22 DIAGNOSIS — R519 Headache, unspecified: Secondary | ICD-10-CM

## 2017-10-22 MED ORDER — IBUPROFEN 600 MG PO TABS: 600.0000 mg | ORAL_TABLET | Freq: Three times a day (TID) | ORAL | 1 refills | Status: DC | PRN

## 2017-12-02 ENCOUNTER — Other Ambulatory Visit: Payer: Self-pay | Admitting: Internal Medicine

## 2017-12-02 DIAGNOSIS — R030 Elevated blood-pressure reading, without diagnosis of hypertension: Secondary | ICD-10-CM

## 2017-12-21 ENCOUNTER — Other Ambulatory Visit: Payer: Self-pay

## 2017-12-21 MED ORDER — PANTOPRAZOLE SODIUM 40 MG PO TBEC: 40.0000 mg | DELAYED_RELEASE_TABLET | Freq: Every day | ORAL | 1 refills | Status: DC

## 2017-12-25 ENCOUNTER — Ambulatory Visit: Payer: PRIVATE HEALTH INSURANCE | Admitting: Nurse Practitioner

## 2017-12-25 ENCOUNTER — Encounter: Payer: Self-pay | Admitting: Nurse Practitioner

## 2017-12-25 VITALS — BP 130/82 | HR 88 | Resp 16 | Ht 59.0 in | Wt 176.0 lb

## 2017-12-25 DIAGNOSIS — Z6838 Body mass index (BMI) 38.0-38.9, adult: Secondary | ICD-10-CM | POA: Diagnosis not present

## 2017-12-25 DIAGNOSIS — I10 Essential (primary) hypertension: Secondary | ICD-10-CM

## 2017-12-25 MED ORDER — PHENTERMINE HCL 37.5 MG PO TABS: 37.5000 mg | ORAL_TABLET | Freq: Every day | ORAL | 1 refills | Status: DC

## 2017-12-25 NOTE — Progress Notes (Signed)
[facility] [address] [place], [place] [address]  Internal MEDICINE  Office Visit Note  Patient Name: Lauren Marsh  [id number]-[id number]  [id number]  Date of Service: 12/27/2017  Chief Complaint  Patient presents with  . Hypertension  . Medical Management of Chronic Issues    Weight Management     Patient comes for weight management. Patient has 2 pounds loss since the last visit. Patient states that she does not take phentermine every day due to sleep disturbance side effect. She takes every third day for two days. She does 10 minutes walks during her lunch break, but wants to [employer] her gym membership. She limits her fast food and tries to eat more vegatables with some protein like chicken or beef. Patient denies any other concerns at this point. Blood pressure is well managed and is currently taking amlodipine 5mg  daily.       Current Medication: Outpatient Encounter Medications as of 12/25/2017  Medication Sig  . acetaminophen (TYLENOL) 500 MG tablet Take by mouth.  Marland Kitchen. amLODipine (NORVASC) 5 MG tablet TAKE 1 TABLET BY MOUTH EVERY DAY  . ibuprofen (ADVIL,MOTRIN) 600 MG tablet Take 1 tablet (600 mg total) by mouth every 8 (eight) hours as needed for headache.  . naproxen sodium (ANAPROX) 550 MG tablet Take 550 mg by mouth 2 (two) times daily with a meal.  . pantoprazole (PROTONIX) 40 MG tablet Take 1 tablet (40 mg total) by mouth daily. Take 1 tablet by mouth daily every morning  . phentermine (ADIPEX-P) 37.5 MG tablet Take 1 tablet (37.5 mg total) by mouth daily before breakfast.  . TRI-LO-MARZIA 0.18/0.215/0.25 MG-25 MCG tab Use as directed for 28 days  . [DISCONTINUED] phentermine (ADIPEX-P) 37.5 MG tablet Take 1 tablet (37.5 mg total) by mouth daily before breakfast.   No facility-administered encounter medications on file as of 12/25/2017.     Surgical History: Past Surgical History:  Procedure Laterality Date  . EYE SURGERY      Medical History: Past  Medical History:  Diagnosis Date  . GERD (gastroesophageal reflux disease)     Family History: Family History  Problem Relation Age of Onset  . Hypertension Mother   . Hypertension Father     Social History   Socioeconomic History  . Marital status: Married    Spouse name: Not on file  . Number of children: Not on file  . Years of education: Not on file  . Highest education level: Not on file  Occupational History  . Not on file  Social Needs  . Financial resource strain: Not on file  . Food insecurity:    Worry: Not on file    Inability: Not on file  . Transportation needs:    Medical: Not on file    Non-medical: Not on file  Tobacco Use  . Smoking status: Never Smoker  . Smokeless tobacco: Never Used  Substance and Sexual Activity  . Alcohol use: Yes    Comment: occasionally  . Drug use: Never  . Sexual activity: Not on file  Lifestyle  . Physical activity:    Days per week: Not on file    Minutes per session: Not on file  . Stress: Not on file  Relationships  . Social connections:    Talks on phone: Not on file    Gets together: Not on file    Attends religious service: Not on file    Active member of club or organization: Not on file  Attends meetings of clubs or organizations: Not on file    Relationship status: Not on file  . Intimate partner violence:    Fear of current or ex partner: Not on file    Emotionally abused: Not on file    Physically abused: Not on file    Forced sexual activity: Not on file  Other Topics Concern  . Not on file  Social History Narrative  . Not on file      Review of Systems  Constitutional: Negative for activity change, appetite change, chills, fatigue, fever and unexpected weight change.       Weight loss of 2 pounds since msot recent visit.   HENT: Negative for congestion, ear pain, facial swelling, hearing loss, postnasal drip, rhinorrhea, sinus pain, sneezing and sore throat.   Eyes: Negative.  Negative for  pain, discharge, redness and visual disturbance.  Respiratory: Negative for cough, chest tightness, shortness of breath and wheezing.   Cardiovascular: Negative for chest pain, palpitations and leg swelling.       Stable blood pressure  Gastrointestinal: Negative for abdominal distention, abdominal pain, anal bleeding, blood in stool, constipation, diarrhea, nausea and vomiting.  Genitourinary: Negative for dysuria, flank pain, frequency, hematuria and urgency.  Musculoskeletal: Negative for arthralgias, back pain, joint swelling and neck pain.  Hematological: Negative for adenopathy. Does not bruise/bleed easily.  Psychiatric/Behavioral: Behavioral problem: Depression.    Vital Signs: BP 130/82   Pulse 88   Resp 16   Ht 4\' 11"  (1.499 m)   Wt 176 lb (79.8 kg)   SpO2 98%   BMI 35.55 kg/m    Physical Exam  Constitutional: She is oriented to person, place, and time. She appears well-developed and well-nourished. No distress.  HENT:  Head: Normocephalic and atraumatic.  Mouth/Throat: No oropharyngeal exudate.  Eyes: Pupils are equal, round, and reactive to light. EOM are normal.  Neck: Normal range of motion. Neck supple.  Cardiovascular: Normal rate, regular rhythm and normal heart sounds. Exam reveals no gallop and no friction rub.  No murmur heard. Pulmonary/Chest: Effort normal and breath sounds normal. No respiratory distress. She has no wheezes. She has no rales. She exhibits no tenderness.  Abdominal: Soft. Bowel sounds are normal.  Musculoskeletal: She exhibits no edema, tenderness or deformity.  Neurological: She is alert and oriented to person, place, and time. No cranial nerve deficit.  Skin: Skin is warm and dry. She is not diaphoretic.  Psychiatric: She has a normal mood and affect. Her behavior is normal. Judgment and thought content normal.  Nursing note and vitals reviewed.      Assessment/Plan:  1. Essential hypertension Stable hypertension, continue to take  Amlodipine    2. Body mass index (bmi) 38.0-38.9, adult Slow but consistent weight loss, continue current medication. Continue with low calorie diet and regular participation in exercise.  - phentermine (ADIPEX-P) 37.5 MG tablet; Take 1 tablet (37.5 mg total) by mouth daily before breakfast.  Dispense: 30 tablet; Refill: 1    General Counseling: tarita deshmukh understanding of the findings of todays visit and agrees with plan of treatment. I have discussed any further diagnostic evaluation that may be needed or ordered today. We also reviewed her medications today. she has been encouraged to call the office with any questions or concerns that should arise related to todays visit.   There is a liability release in patients' chart. There has been a 10 minute discussion about the side effects including but not limited to elevated blood pressure, anxiety,  lack of sleep and dry mouth. Pt understands and will like to start/continue on appetite suppressant at this time. There will be one month RX given at the time of visit with proper follow up. Nova diet plan with restricted calories is given to the pt. Pt understands and agrees with  plan of treatment  This patient was seen by Vincent Gros FNP Collaboration with Dr Lyndon Code as a part of collaborative care agreement  Meds ordered this encounter  Medications  . phentermine (ADIPEX-P) 37.5 MG tablet    Sig: Take 1 tablet (37.5 mg total) by mouth daily before breakfast.    Dispense:  30 tablet    Refill:  1    Order Specific Question:   Supervising Provider    Answer:   Lyndon Code [[id number]]    Time spent: 27 Minutes      Dr Lyndon Code Internal medicine

## 2018-01-25 ENCOUNTER — Encounter: Payer: Self-pay | Admitting: Adult Health

## 2018-01-25 ENCOUNTER — Ambulatory Visit: Payer: PRIVATE HEALTH INSURANCE | Admitting: Adult Health

## 2018-01-25 VITALS — BP 128/86 | HR 106 | Temp 98.3°F | Resp 16 | Ht 59.0 in | Wt 173.0 lb

## 2018-01-25 DIAGNOSIS — R05 Cough: Secondary | ICD-10-CM

## 2018-01-25 DIAGNOSIS — R030 Elevated blood-pressure reading, without diagnosis of hypertension: Secondary | ICD-10-CM

## 2018-01-25 DIAGNOSIS — J0110 Acute frontal sinusitis, unspecified: Secondary | ICD-10-CM | POA: Diagnosis not present

## 2018-01-25 DIAGNOSIS — R059 Cough, unspecified: Secondary | ICD-10-CM

## 2018-01-25 MED ORDER — AMOXICILLIN-POT CLAVULANATE 875-125 MG PO TABS: 1.0000 | ORAL_TABLET | Freq: Two times a day (BID) | ORAL | 0 refills | Status: DC

## 2018-01-25 NOTE — Patient Instructions (Signed)

## 2018-01-25 NOTE — Progress Notes (Signed)
[facility] [address] [place], [place] [address]  Internal MEDICINE  Office Visit Note  Patient Name: Lauren Marsh  [id number]/2065  [id number]  Date of Service: 01/25/2018  Chief Complaint  Patient presents with  . Cough    started thursday,been taking dayquil and nightquil  . Nasal Congestion     HPI Pt is here for a sick visit. Pt reports 1 week of congestion, cough, PND, sinus pressure.  She report chest congestion is her main concern.  She has a mildly productive cough.  Denies fever/chills.  She has been taking dayquil/Nyquil with some relief.    Current Medication:  Outpatient Encounter Medications as of 01/25/2018  Medication Sig  . acetaminophen (TYLENOL) 500 MG tablet Take by mouth.  Marland Kitchen. amLODipine (NORVASC) 5 MG tablet TAKE 1 TABLET BY MOUTH EVERY DAY  . DM-Doxylamine-Acetaminophen (NYQUIL COLD & FLU PO) Take by mouth.  Marland Kitchen. ibuprofen (ADVIL,MOTRIN) 600 MG tablet Take 1 tablet (600 mg total) by mouth every 8 (eight) hours as needed for headache.  . naproxen sodium (ANAPROX) 550 MG tablet Take 550 mg by mouth 2 (two) times daily with a meal.  . pantoprazole (PROTONIX) 40 MG tablet Take 1 tablet (40 mg total) by mouth daily. Take 1 tablet by mouth daily every morning  . phentermine (ADIPEX-P) 37.5 MG tablet Take 1 tablet (37.5 mg total) by mouth daily before breakfast.  . Pseudoephedrine-APAP-DM (DAYQUIL MULTI-SYMPTOM COLD/FLU PO) Take by mouth.  . TRI-LO-MARZIA 0.18/0.215/0.25 MG-25 MCG tab Use as directed for 28 days  . amoxicillin-clavulanate (AUGMENTIN) 875-125 MG tablet Take 1 tablet by mouth 2 (two) times daily.   No facility-administered encounter medications on file as of 01/25/2018.       Medical History: Past Medical History:  Diagnosis Date  . GERD (gastroesophageal reflux disease)      Vital Signs: BP 128/86 (BP Location: Left Arm, Patient Position: Sitting, Cuff Size: Normal)   Pulse (!) 106   Temp 98.3 F (36.8 C) (Oral)    Resp 16   Ht 4\' 11"  (1.499 m)   Wt 173 lb (78.5 kg)   SpO2 95%   BMI 34.94 kg/m    Review of Systems  Constitutional: Negative for chills, fatigue and unexpected weight change.  HENT: Positive for postnasal drip, rhinorrhea, sinus pressure and sore throat. Negative for congestion and sneezing.   Eyes: Negative for photophobia, pain and redness.  Respiratory: Positive for cough. Negative for chest tightness and shortness of breath.   Cardiovascular: Negative for chest pain and palpitations.  Gastrointestinal: Negative for abdominal pain, constipation, diarrhea, nausea and vomiting.  Endocrine: Negative.   Genitourinary: Negative for dysuria and frequency.  Musculoskeletal: Negative for arthralgias, back pain, joint swelling and neck pain.  Skin: Negative for rash.  Allergic/Immunologic: Negative.   Neurological: Negative for tremors and numbness.  Hematological: Negative for adenopathy. Does not bruise/bleed easily.  Psychiatric/Behavioral: Negative for behavioral problems and sleep disturbance. The patient is not nervous/anxious.     Physical Exam Vitals signs and nursing note reviewed.  Constitutional:      General: She is not in acute distress.    Appearance: She is well-developed. She is not diaphoretic.  HENT:     Head: Normocephalic and atraumatic.     Mouth/Throat:     Mouth: Mucous membranes are moist.     Pharynx: No oropharyngeal exudate.  Eyes:     Pupils: Pupils are equal, round, and reactive to light.  Neck:     Musculoskeletal: Normal range  of motion and neck supple.     Thyroid: No thyromegaly.     Vascular: No JVD.     Trachea: No tracheal deviation.  Cardiovascular:     Rate and Rhythm: Normal rate and regular rhythm.     Heart sounds: Normal heart sounds. No murmur. No friction rub. No gallop.   Pulmonary:     Effort: Pulmonary effort is normal. No respiratory distress.     Breath sounds: Normal breath sounds. No wheezing or rales.  Chest:     Chest  wall: No tenderness.  Abdominal:     Palpations: Abdomen is soft.     Tenderness: There is no abdominal tenderness. There is no guarding.  Musculoskeletal: Normal range of motion.  Lymphadenopathy:     Cervical: No cervical adenopathy.  Skin:    General: Skin is warm and dry.  Neurological:     Mental Status: She is alert and oriented to person, place, and time.     Cranial Nerves: No cranial nerve deficit.  Psychiatric:        Behavior: Behavior normal.        Thought Content: Thought content normal.        Judgment: Judgment normal.    Assessment/Plan: 1. Acute non-recurrent frontal sinusitis Patient prescribed a course of Augmentin.  She is instructed to return to clinic in 7 to 10 days if symptoms do not improve. - amoxicillin-clavulanate (AUGMENTIN) 875-125 MG tablet; Take 1 tablet by mouth 2 (two) times daily.  Dispense: 14 tablet; Refill: 0  2. Cough Patient will continue using OTC medication for symptom management.  3. Elevated blood pressure reading Resolved at this time.  Patient's blood pressure 128/86.  Denies any headache, chest pain, shortness of breath. General Counseling: katriel cutsforth understanding of the findings of todays visit and agrees with plan of treatment. I have discussed any further diagnostic evaluation that may be needed or ordered today. We also reviewed her medications today. she has been encouraged to call the office with any questions or concerns that should arise related to todays visit.   No orders of the defined types were placed in this encounter.   Meds ordered this encounter  Medications  . amoxicillin-clavulanate (AUGMENTIN) 875-125 MG tablet    Sig: Take 1 tablet by mouth 2 (two) times daily.    Dispense:  14 tablet    Refill:  0    Time spent: 25 Minutes  This patient was seen by Blima Ledger AGNP-C in Collaboration with Dr Lyndon Code as a part of collaborative care agreement.  Johnna Acosta AGNP-C Internal  Medicine

## 2018-02-11 ENCOUNTER — Ambulatory Visit: Payer: Self-pay | Admitting: Nurse Practitioner

## 2018-02-16 ENCOUNTER — Ambulatory Visit: Payer: PRIVATE HEALTH INSURANCE | Admitting: Nurse Practitioner

## 2018-02-16 ENCOUNTER — Encounter: Payer: Self-pay | Admitting: Nurse Practitioner

## 2018-02-16 VITALS — BP 130/80 | HR 95 | Resp 16 | Ht 59.0 in | Wt 178.8 lb

## 2018-02-16 DIAGNOSIS — I10 Essential (primary) hypertension: Secondary | ICD-10-CM | POA: Diagnosis not present

## 2018-02-16 DIAGNOSIS — Z6836 Body mass index (BMI) 36.0-36.9, adult: Secondary | ICD-10-CM | POA: Diagnosis not present

## 2018-02-16 NOTE — Progress Notes (Signed)
[facility] [address] [place], [place] [address]  Internal MEDICINE  Office Visit Note  Patient Name: Lauren Marsh  [id number]  [id number]  Date of Service: 02/17/2018  Chief Complaint  Patient presents with  . Medical Management of Chronic Issues    Weight management  . Gastroesophageal Reflux    The patient is here for follow up of weight loss. She is taking phentermine, 1/2 tablet in the mornings. Has had gained five pounds since her last visit. States that she did not manage her diet as well as she should have over tho holidays. Has joined [employer] and plans to start going this month. Blood pressure remains well controlled.       Current Medication: Outpatient Encounter Medications as of 02/16/2018  Medication Sig  . acetaminophen (TYLENOL) 500 MG tablet Take by mouth.  Marland Kitchen. amLODipine (NORVASC) 5 MG tablet TAKE 1 TABLET BY MOUTH EVERY DAY  . ibuprofen (ADVIL,MOTRIN) 600 MG tablet Take 1 tablet (600 mg total) by mouth every 8 (eight) hours as needed for headache.  . pantoprazole (PROTONIX) 40 MG tablet Take 1 tablet (40 mg total) by mouth daily. Take 1 tablet by mouth daily every morning  . phentermine (ADIPEX-P) 37.5 MG tablet Take 1 tablet (37.5 mg total) by mouth daily before breakfast.  . TRI-LO-MARZIA 0.18/0.215/0.25 MG-25 MCG tab Use as directed for 28 days  . [DISCONTINUED] amoxicillin-clavulanate (AUGMENTIN) 875-125 MG tablet Take 1 tablet by mouth 2 (two) times daily.  . [DISCONTINUED] naproxen sodium (ANAPROX) 550 MG tablet Take 550 mg by mouth 2 (two) times daily with a meal.  . [DISCONTINUED] DM-Doxylamine-Acetaminophen (NYQUIL COLD & FLU PO) Take by mouth.  . [DISCONTINUED] Pseudoephedrine-APAP-DM (DAYQUIL MULTI-SYMPTOM COLD/FLU PO) Take by mouth.   No facility-administered encounter medications on file as of 02/16/2018.     Surgical History: Past Surgical History:  Procedure Laterality Date  . EYE SURGERY      Medical History: Past  Medical History:  Diagnosis Date  . GERD (gastroesophageal reflux disease)     Family History: Family History  Problem Relation Age of Onset  . Hypertension Mother   . Hypertension Father     Social History   Socioeconomic History  . Marital status: Married    Spouse name: Not on file  . Number of children: Not on file  . Years of education: Not on file  . Highest education level: Not on file  Occupational History  . Not on file  Social Needs  . Financial resource strain: Not on file  . Food insecurity:    Worry: Not on file    Inability: Not on file  . Transportation needs:    Medical: Not on file    Non-medical: Not on file  Tobacco Use  . Smoking status: Never Smoker  . Smokeless tobacco: Never Used  Substance and Sexual Activity  . Alcohol use: Yes    Comment: occasionally  . Drug use: Never  . Sexual activity: Not on file  Lifestyle  . Physical activity:    Days per week: Not on file    Minutes per session: Not on file  . Stress: Not on file  Relationships  . Social connections:    Talks on phone: Not on file    Gets together: Not on file    Attends religious service: Not on file    Active member of club or organization: Not on file    Attends meetings of clubs or organizations: Not on file  Relationship status: Not on file  . Intimate partner violence:    Fear of current or ex partner: Not on file    Emotionally abused: Not on file    Physically abused: Not on file    Forced sexual activity: Not on file  Other Topics Concern  . Not on file  Social History Narrative  . Not on file      Review of Systems  Constitutional: Negative for activity change, appetite change, chills, fatigue, fever and unexpected weight change.       Five pound weight gain since last visit.   HENT: Negative for congestion, ear pain, facial swelling, hearing loss, postnasal drip, rhinorrhea, sinus pain, sneezing and sore throat.   Respiratory: Negative for cough, chest  tightness, shortness of breath and wheezing.   Cardiovascular: Negative for chest pain, palpitations and leg swelling.       Stable blood pressure  Gastrointestinal: Negative for abdominal distention, abdominal pain, anal bleeding, blood in stool, constipation, diarrhea, nausea and vomiting.  Skin: Negative for rash.  Allergic/Immunologic: Negative for environmental allergies.  Neurological: Negative for dizziness and headaches.  Hematological: Negative for adenopathy. Does not bruise/bleed easily.  Psychiatric/Behavioral: Behavioral problem: Depression.    Today's Vitals   02/16/18 1544  BP: 130/80  Pulse: 95  Resp: 16  SpO2: 95%  Weight: 178 lb 12.8 oz (81.1 kg)  Height: 4\' 11"  (1.499 m)  Body mass index is 36.11 kg/m.  Physical Exam Vitals signs and nursing note reviewed.  Constitutional:      General: She is not in acute distress.    Appearance: She is well-developed. She is obese. She is not diaphoretic.  HENT:     Head: Normocephalic and atraumatic.     Mouth/Throat:     Pharynx: No oropharyngeal exudate.  Eyes:     Pupils: Pupils are equal, round, and reactive to light.  Neck:     Musculoskeletal: Normal range of motion and neck supple.  Cardiovascular:     Rate and Rhythm: Normal rate and regular rhythm.     Heart sounds: Normal heart sounds. No murmur. No friction rub. No gallop.   Pulmonary:     Effort: Pulmonary effort is normal. No respiratory distress.     Breath sounds: Normal breath sounds. No wheezing or rales.  Chest:     Chest wall: No tenderness.  Abdominal:     General: Bowel sounds are normal.     Palpations: Abdomen is soft.  Musculoskeletal:        General: No tenderness or deformity.  Skin:    General: Skin is warm and dry.  Neurological:     Mental Status: She is alert and oriented to person, place, and time.     Cranial Nerves: No cranial nerve deficit.  Psychiatric:        Behavior: Behavior normal.        Thought Content: Thought  content normal.        Judgment: Judgment normal.    Assessment/Plan:  1. Essential hypertension Stable. Continue bp medication as prescribed   2. Body mass index (bmi) 36.0-36.9, adult May continue phentermine 37.5mg  tablets. Only taking 1/2 tablet daily. Limit calorie intake to 1500 calories per day. Incorporate exercise into daily routine.   General Counseling: quincie seiss understanding of the findings of todays visit and agrees with plan of treatment. I have discussed any further diagnostic evaluation that may be needed or ordered today. We also reviewed her medications today. she has been  encouraged to call the office with any questions or concerns that should arise related to todays visit.   There is a liability release in patients' chart. There has been a 10 minute discussion about the side effects including but not limited to elevated blood pressure, anxiety, lack of sleep and dry mouth. Pt understands and will like to start/continue on appetite suppressant at this time. There will be one month RX given at the time of visit with proper follow up. Nova diet plan with restricted calories is given to the pt. Pt understands and agrees with  plan of treatment  This patient was seen by Vincent Gros FNP Collaboration with Dr Lyndon Code as a part of collaborative care agreement   Time spent: 15 Minutes      Dr Lyndon Code Internal medicine

## 2018-03-18 ENCOUNTER — Encounter (INDEPENDENT_AMBULATORY_CARE_PROVIDER_SITE_OTHER): Payer: Self-pay

## 2018-03-18 ENCOUNTER — Ambulatory Visit: Payer: PRIVATE HEALTH INSURANCE | Admitting: Nurse Practitioner

## 2018-03-18 ENCOUNTER — Encounter: Payer: Self-pay | Admitting: Nurse Practitioner

## 2018-03-18 VITALS — BP 120/80 | HR 78 | Resp 16 | Ht 59.0 in | Wt 178.0 lb

## 2018-03-18 DIAGNOSIS — I10 Essential (primary) hypertension: Secondary | ICD-10-CM

## 2018-03-18 DIAGNOSIS — Z6838 Body mass index (BMI) 38.0-38.9, adult: Secondary | ICD-10-CM

## 2018-03-18 MED ORDER — PHENTERMINE HCL 37.5 MG PO TABS: 37.5000 mg | ORAL_TABLET | Freq: Every day | ORAL | 1 refills | Status: DC

## 2018-03-18 NOTE — Progress Notes (Signed)
[facility] [address] [place], [place] [address]  Internal MEDICINE  Office Visit Note  Patient Name: Lauren Marsh  [id number]  [id number]  Date of Service: 03/18/2018  Chief Complaint  Patient presents with  . Medical Management of Chronic Issues    weight management    The patient is here for follow up of weight loss. She is taking phentermine, 1/2 tablet in the mornings. Has has maintained her weight since her last visit.  Has joined [employer]. Has started going two or thre nights each week. Is getting frustrated because she feels like she isn't losing weight. She has noted clothing fitting looser.       Current Medication: Outpatient Encounter Medications as of 03/18/2018  Medication Sig  . acetaminophen (TYLENOL) 500 MG tablet Take by mouth.  Marland Kitchen amLODipine (NORVASC) 5 MG tablet TAKE 1 TABLET BY MOUTH EVERY DAY  . ibuprofen (ADVIL,MOTRIN) 600 MG tablet Take 1 tablet (600 mg total) by mouth every 8 (eight) hours as needed for headache.  . pantoprazole (PROTONIX) 40 MG tablet Take 1 tablet (40 mg total) by mouth daily. Take 1 tablet by mouth daily every morning  . phentermine (ADIPEX-P) 37.5 MG tablet Take 1 tablet (37.5 mg total) by mouth daily before breakfast.  . TRI-LO-MARZIA 0.18/0.215/0.25 MG-25 MCG tab Use as directed for 28 days  . [DISCONTINUED] phentermine (ADIPEX-P) 37.5 MG tablet Take 1 tablet (37.5 mg total) by mouth daily before breakfast.   No facility-administered encounter medications on file as of 03/18/2018.     Surgical History: Past Surgical History:  Procedure Laterality Date  . EYE SURGERY      Medical History: Past Medical History:  Diagnosis Date  . GERD (gastroesophageal reflux disease)     Family History: Family History  Problem Relation Age of Onset  . Hypertension Mother   . Hypertension Father     Social History   Socioeconomic History  . Marital status: Married    Spouse name: Not on file  . Number of  children: Not on file  . Years of education: Not on file  . Highest education level: Not on file  Occupational History  . Not on file  Social Needs  . Financial resource strain: Not on file  . Food insecurity:    Worry: Not on file    Inability: Not on file  . Transportation needs:    Medical: Not on file    Non-medical: Not on file  Tobacco Use  . Smoking status: Never Smoker  . Smokeless tobacco: Never Used  Substance and Sexual Activity  . Alcohol use: Yes    Comment: occasionally  . Drug use: Never  . Sexual activity: Not on file  Lifestyle  . Physical activity:    Days per week: Not on file    Minutes per session: Not on file  . Stress: Not on file  Relationships  . Social connections:    Talks on phone: Not on file    Gets together: Not on file    Attends religious service: Not on file    Active member of club or organization: Not on file    Attends meetings of clubs or organizations: Not on file    Relationship status: Not on file  . Intimate partner violence:    Fear of current or ex partner: Not on file    Emotionally abused: Not on file    Physically abused: Not on file    Forced sexual activity: Not  on file  Other Topics Concern  . Not on file  Social History Narrative  . Not on file      Review of Systems  Constitutional: Negative for activity change, appetite change, chills, fatigue, fever and unexpected weight change.       Maintained her weight since her last visit .  HENT: Negative for congestion, ear pain, facial swelling, hearing loss, postnasal drip, rhinorrhea, sinus pain, sneezing and sore throat.   Respiratory: Negative for cough, chest tightness, shortness of breath and wheezing.   Cardiovascular: Negative for chest pain, palpitations and leg swelling.       Stable blood pressure  Gastrointestinal: Negative for abdominal distention, abdominal pain, anal bleeding, blood in stool, constipation, diarrhea, nausea and vomiting.  Skin: Negative  for rash.  Allergic/Immunologic: Negative for environmental allergies.  Neurological: Negative for dizziness and headaches.  Hematological: Negative for adenopathy. Does not bruise/bleed easily.  Psychiatric/Behavioral: Behavioral problem: Depression.    Today's Vitals   03/18/18 1328  BP: 120/80  Pulse: 78  Resp: 16  SpO2: 98%  Weight: 178 lb (80.7 kg)  Height: 4\' 11"  (1.499 m)   Body mass index is 35.95 kg/m.  Physical Exam Vitals signs and nursing note reviewed.  Constitutional:      General: She is not in acute distress.    Appearance: She is well-developed. She is obese. She is not diaphoretic.  HENT:     Head: Normocephalic and atraumatic.     Mouth/Throat:     Pharynx: No oropharyngeal exudate.  Eyes:     Pupils: Pupils are equal, round, and reactive to light.  Neck:     Musculoskeletal: Normal range of motion and neck supple.  Cardiovascular:     Rate and Rhythm: Normal rate and regular rhythm.     Heart sounds: Normal heart sounds. No murmur. No friction rub. No gallop.   Pulmonary:     Effort: Pulmonary effort is normal. No respiratory distress.     Breath sounds: Normal breath sounds. No wheezing or rales.  Chest:     Chest wall: No tenderness.  Abdominal:     General: Bowel sounds are normal.     Palpations: Abdomen is soft.  Musculoskeletal:        General: No tenderness or deformity.  Skin:    General: Skin is warm and dry.  Neurological:     Mental Status: She is alert and oriented to person, place, and time.     Cranial Nerves: No cranial nerve deficit.  Psychiatric:        Behavior: Behavior normal.        Thought Content: Thought content normal.        Judgment: Judgment normal.    Assessment/Plan: 1. Essential hypertension Stable. Continue bp medication as prescribed   2. Body mass index (bmi) 38.0-38.9, adult Continue phentermine as prescribed. Maintain low calorie diet and regular exercise program.  - phentermine (ADIPEX-P) 37.5 MG  tablet; Take 1 tablet (37.5 mg total) by mouth daily before breakfast.  Dispense: 30 tablet; Refill: 1  General Counseling: Lauren Marsh verbalizes understanding of the findings of todays visit and agrees with plan of treatment. I have discussed any further diagnostic evaluation that may be needed or ordered today. We also reviewed her medications today. she has been encouraged to call the office with any questions or concerns that should arise related to todays visit.   There is a liability release in patients' chart. There has been a 10 minute discussion about  the side effects including but not limited to elevated blood pressure, anxiety, lack of sleep and dry mouth. Pt understands and will like to start/continue on appetite suppressant at this time. There will be one month RX given at the time of visit with proper follow up. Nova diet plan with restricted calories is given to the pt. Pt understands and agrees with  plan of treatment  This patient was seen by Vincent Gros FNP Collaboration with Dr Lyndon Code as a part of collaborative care agreement  Meds ordered this encounter  Medications  . phentermine (ADIPEX-P) 37.5 MG tablet    Sig: Take 1 tablet (37.5 mg total) by mouth daily before breakfast.    Dispense:  30 tablet    Refill:  1    Order Specific Question:   Supervising Provider    Answer:   Lyndon Code [[id number]]    Time spent: 14 Minutes      Dr Lyndon Code Internal medicine

## 2018-04-29 ENCOUNTER — Encounter: Payer: Self-pay | Admitting: Adult Health

## 2018-04-29 ENCOUNTER — Ambulatory Visit: Payer: PRIVATE HEALTH INSURANCE | Admitting: Adult Health

## 2018-04-29 ENCOUNTER — Other Ambulatory Visit: Payer: Self-pay

## 2018-04-29 VITALS — BP 110/80 | HR 98 | Resp 16 | Ht 59.0 in | Wt 173.0 lb

## 2018-04-29 DIAGNOSIS — Z6833 Body mass index (BMI) 33.0-33.9, adult: Secondary | ICD-10-CM

## 2018-04-29 DIAGNOSIS — E6609 Other obesity due to excess calories: Secondary | ICD-10-CM | POA: Diagnosis not present

## 2018-04-29 DIAGNOSIS — I10 Essential (primary) hypertension: Secondary | ICD-10-CM

## 2018-04-29 MED ORDER — PHENTERMINE HCL 37.5 MG PO TABS: 37.5000 mg | ORAL_TABLET | Freq: Every day | ORAL | 0 refills | Status: DC

## 2018-04-29 NOTE — Progress Notes (Signed)
[facility] [address] [place], [place] [address]  Internal MEDICINE  Office Visit Note  Patient Name: Lauren Marsh  [id number]  [id number]  Date of Service: 04/29/2018  Chief Complaint  Patient presents with  . Medical Management of Chronic Issues    weight management    HPI Patient is using phentermine for weight loss.  Since our last visit they have lost 5 pounds.  The patient denies any chest pain, palpitations, shortness of breath, constipation, headaches or any other side effects of the medication.  Patient wishes to continue to use this medication for weight loss at this time.   Current Medication: Outpatient Encounter Medications as of 04/29/2018  Medication Sig  . acetaminophen (TYLENOL) 500 MG tablet Take by mouth.  Marland Kitchen amLODipine (NORVASC) 5 MG tablet TAKE 1 TABLET BY MOUTH EVERY DAY  . ibuprofen (ADVIL,MOTRIN) 600 MG tablet Take 1 tablet (600 mg total) by mouth every 8 (eight) hours as needed for headache.  . pantoprazole (PROTONIX) 40 MG tablet Take 1 tablet (40 mg total) by mouth daily. Take 1 tablet by mouth daily every morning  . phentermine (ADIPEX-P) 37.5 MG tablet Take 1 tablet (37.5 mg total) by mouth daily before breakfast.  . TRI-LO-MARZIA 0.18/0.215/0.25 MG-25 MCG tab Use as directed for 28 days  . [DISCONTINUED] phentermine (ADIPEX-P) 37.5 MG tablet Take 1 tablet (37.5 mg total) by mouth daily before breakfast.   No facility-administered encounter medications on file as of 04/29/2018.     Surgical History: Past Surgical History:  Procedure Laterality Date  . EYE SURGERY      Medical History: Past Medical History:  Diagnosis Date  . GERD (gastroesophageal reflux disease)     Family History: Family History  Problem Relation Age of Onset  . Hypertension Mother   . Hypertension Father     Social History   Socioeconomic History  . Marital status: Married    Spouse name: Not on file  . Number of children: Not on file  .  Years of education: Not on file  . Highest education level: Not on file  Occupational History  . Not on file  Social Needs  . Financial resource strain: Not on file  . Food insecurity:    Worry: Not on file    Inability: Not on file  . Transportation needs:    Medical: Not on file    Non-medical: Not on file  Tobacco Use  . Smoking status: Never Smoker  . Smokeless tobacco: Never Used  Substance and Sexual Activity  . Alcohol use: Yes    Comment: occasionally  . Drug use: Never  . Sexual activity: Not on file  Lifestyle  . Physical activity:    Days per week: Not on file    Minutes per session: Not on file  . Stress: Not on file  Relationships  . Social connections:    Talks on phone: Not on file    Gets together: Not on file    Attends religious service: Not on file    Active member of club or organization: Not on file    Attends meetings of clubs or organizations: Not on file    Relationship status: Not on file  . Intimate partner violence:    Fear of current or ex partner: Not on file    Emotionally abused: Not on file    Physically abused: Not on file    Forced sexual activity: Not on file  Other Topics Concern  . Not  on file  Social History Narrative  . Not on file      Review of Systems  Constitutional: Negative for chills, fatigue and unexpected weight change.  HENT: Negative for congestion, rhinorrhea, sneezing and sore throat.   Eyes: Negative for photophobia, pain and redness.  Respiratory: Negative for cough, chest tightness and shortness of breath.   Cardiovascular: Negative for chest pain and palpitations.  Gastrointestinal: Negative for abdominal pain, constipation, diarrhea, nausea and vomiting.  Endocrine: Negative.   Genitourinary: Negative for dysuria and frequency.  Musculoskeletal: Negative for arthralgias, back pain, joint swelling and neck pain.  Skin: Negative for rash.  Allergic/Immunologic: Negative.   Neurological: Negative for  tremors and numbness.  Hematological: Negative for adenopathy. Does not bruise/bleed easily.  Psychiatric/Behavioral: Negative for behavioral problems and sleep disturbance. The patient is not nervous/anxious.     Vital Signs: BP 110/80   Pulse 98   Resp 16   Ht 4\' 11"  (1.499 m)   Wt 173 lb (78.5 kg)   SpO2 98%   BMI 34.94 kg/m    Physical Exam Vitals signs and nursing note reviewed.  Constitutional:      General: She is not in acute distress.    Appearance: She is well-developed. She is not diaphoretic.  HENT:     Head: Normocephalic and atraumatic.     Mouth/Throat:     Pharynx: No oropharyngeal exudate.  Eyes:     Pupils: Pupils are equal, round, and reactive to light.  Neck:     Musculoskeletal: Normal range of motion and neck supple.     Thyroid: No thyromegaly.     Vascular: No JVD.     Trachea: No tracheal deviation.  Cardiovascular:     Rate and Rhythm: Normal rate and regular rhythm.     Heart sounds: Normal heart sounds. No murmur. No friction rub. No gallop.   Pulmonary:     Effort: Pulmonary effort is normal. No respiratory distress.     Breath sounds: Normal breath sounds. No wheezing or rales.  Chest:     Chest wall: No tenderness.  Abdominal:     Palpations: Abdomen is soft.     Tenderness: There is no abdominal tenderness. There is no guarding.  Musculoskeletal: Normal range of motion.  Lymphadenopathy:     Cervical: No cervical adenopathy.  Skin:    General: Skin is warm and dry.  Neurological:     Mental Status: She is alert and oriented to person, place, and time.     Cranial Nerves: No cranial nerve deficit.  Psychiatric:        Behavior: Behavior normal.        Thought Content: Thought content normal.        Judgment: Judgment normal.    Assessment/Plan: 1. Essential hypertension Stable, continue current medications.   2. Class 1 obesity due to excess calories without serious comorbidity with body mass index (BMI) of 33.0 to 33.9 in  adult Obesity Counseling: Risk Assessment: An assessment of behavioral risk factors was made today and includes lack of exercise sedentary lifestyle, lack of portion control and poor dietary habits.  Risk Modification Advice: She was counseled on portion control guidelines. Restricting daily caloric intake to. . The detrimental long term effects of obesity on her health and ongoing poor compliance was also discussed with the patient.  There is a liability release in patients' chart. There has been a 10 minute discussion about the side effects including but not limited to elevated blood  pressure, anxiety, lack of sleep and dry mouth. Pt understands and will like to start/continue on appetite suppressant at this time. There will be one month RX given at the time of visit with proper follow up. Nova diet plan with restricted calories is given to the pt. Pt understands and agrees with  plan of treatment - phentermine (ADIPEX-P) 37.5 MG tablet; Take 1 tablet (37.5 mg total) by mouth daily before breakfast.  Dispense: 30 tablet; Refill: 0  General Counseling: kerly wears understanding of the findings of todays visit and agrees with plan of treatment. I have discussed any further diagnostic evaluation that may be needed or ordered today. We also reviewed her medications today. she has been encouraged to call the office with any questions or concerns that should arise related to todays visit.    No orders of the defined types were placed in this encounter.   Meds ordered this encounter  Medications  . phentermine (ADIPEX-P) 37.5 MG tablet    Sig: Take 1 tablet (37.5 mg total) by mouth daily before breakfast.    Dispense:  30 tablet    Refill:  0    Time spent: 20 Minutes   This patient was seen by Blima Ledger AGNP-C in Collaboration with Dr Lyndon Code as a part of collaborative care agreement     Johnna Acosta AGNP-C Internal medicine

## 2018-04-29 NOTE — Patient Instructions (Signed)

## 2018-06-07 ENCOUNTER — Other Ambulatory Visit: Payer: Self-pay

## 2018-06-07 MED ORDER — PANTOPRAZOLE SODIUM 40 MG PO TBEC: 40.0000 mg | DELAYED_RELEASE_TABLET | Freq: Every day | ORAL | 1 refills | Status: DC

## 2018-06-10 ENCOUNTER — Other Ambulatory Visit: Payer: Self-pay

## 2018-06-10 ENCOUNTER — Ambulatory Visit: Payer: PRIVATE HEALTH INSURANCE | Admitting: Nurse Practitioner

## 2018-07-06 ENCOUNTER — Other Ambulatory Visit: Payer: Self-pay

## 2018-07-06 MED ORDER — TRI-LO-MARZIA 0.18/0.215/0.25 MG-25 MCG PO TABS: ORAL_TABLET | ORAL | 12 refills | Status: DC

## 2018-07-14 ENCOUNTER — Encounter: Payer: Self-pay | Admitting: Nurse Practitioner

## 2018-07-14 ENCOUNTER — Ambulatory Visit: Payer: PRIVATE HEALTH INSURANCE | Admitting: Nurse Practitioner

## 2018-07-14 ENCOUNTER — Other Ambulatory Visit: Payer: Self-pay

## 2018-07-14 VITALS — Temp 97.6°F | Ht 59.0 in | Wt 174.0 lb

## 2018-07-14 DIAGNOSIS — Z3041 Encounter for surveillance of contraceptive pills: Secondary | ICD-10-CM

## 2018-07-14 DIAGNOSIS — J0110 Acute frontal sinusitis, unspecified: Secondary | ICD-10-CM | POA: Diagnosis not present

## 2018-07-14 DIAGNOSIS — I10 Essential (primary) hypertension: Secondary | ICD-10-CM | POA: Diagnosis not present

## 2018-07-14 MED ORDER — AZITHROMYCIN 250 MG PO TABS: ORAL_TABLET | ORAL | 0 refills | Status: DC

## 2018-07-14 MED ORDER — TRI-LO-MARZIA 0.18/0.215/0.25 MG-25 MCG PO TABS: ORAL_TABLET | ORAL | 12 refills | Status: DC

## 2018-07-14 NOTE — Progress Notes (Signed)
[facility] [address] [place], [place] [address]  Internal MEDICINE  Telephone Visit  Patient Name: Lauren Marsh  [id number]  [id number]  Date of Service: 07/21/2018  I connected with the patient at 10:50am by webcam and verified the patients identity using two identifiers.   I discussed the limitations, risks, security and privacy concerns of performing an evaluation and management service by webcam and the availability of in person appointments. I also discussed with the patient that there may be a patient responsible charge related to the service.  The patient expressed understanding and agrees to proceed.    Chief Complaint  Patient presents with  . Telephone Screen    VIDEO VISIT [phone]  . Telephone Assessment  . Sinusitis    possible sinus infection,   . Headache    right between the eyes since yesterday feels pressure when lookin from left to right  . Chills    pt just returned to work, yesterday she got cold chills and everytime she would go in the office she would get the headache, bodyaches, some hot sweats, no fever, pt has nto been in contact with anyone sick that she know of, no sore throat    The patient has been contacted via webcam for follow up visit due to concerns for spread of novel coronavirus. The patient states that she is having congestion and sinus pressure across the eyes and forehead. Started yesterday. She denies fever or cough. Denies nausea or vomiting.       Current Medication: Outpatient Encounter Medications as of 07/14/2018  Medication Sig  . acetaminophen (TYLENOL) 500 MG tablet Take by mouth.  Marland Kitchen amLODipine (NORVASC) 5 MG tablet TAKE 1 TABLET BY MOUTH EVERY DAY  . ibuprofen (ADVIL,MOTRIN) 600 MG tablet Take 1 tablet (600 mg total) by mouth every 8 (eight) hours as needed for headache.  . pantoprazole (PROTONIX) 40 MG tablet Take 1 tablet (40 mg total) by mouth daily. Take 1 tablet by mouth daily every morning  .  phentermine (ADIPEX-P) 37.5 MG tablet Take 1 tablet (37.5 mg total) by mouth daily before breakfast.  . TRI-LO-MARZIA 0.18/0.215/0.25 MG-25 MCG tab Use as directed for 28 days  . [DISCONTINUED] TRI-LO-MARZIA 0.18/0.215/0.25 MG-25 MCG tab Use as directed for 28 days  . azithromycin (ZITHROMAX) 250 MG tablet z-pack - take as directed for 5 days for acute sinusitis (Patient not taking: Reported on 07/20/2018)   No facility-administered encounter medications on file as of 07/14/2018.     Surgical History: Past Surgical History:  Procedure Laterality Date  . EYE SURGERY      Medical History: Past Medical History:  Diagnosis Date  . GERD (gastroesophageal reflux disease)     Family History: Family History  Problem Relation Age of Onset  . Hypertension Mother   . Hypertension Father     Social History   Socioeconomic History  . Marital status: Married    Spouse name: Not on file  . Number of children: Not on file  . Years of education: Not on file  . Highest education level: Not on file  Occupational History  . Not on file  Social Needs  . Financial resource strain: Not on file  . Food insecurity:    Worry: Not on file    Inability: Not on file  . Transportation needs:    Medical: Not on file    Non-medical: Not on file  Tobacco Use  . Smoking status: Never Smoker  . Smokeless tobacco: Never  Used  Substance and Sexual Activity  . Alcohol use: Yes    Comment: occasionally  . Drug use: Never  . Sexual activity: Not on file  Lifestyle  . Physical activity:    Days per week: Not on file    Minutes per session: Not on file  . Stress: Not on file  Relationships  . Social connections:    Talks on phone: Not on file    Gets together: Not on file    Attends religious service: Not on file    Active member of club or organization: Not on file    Attends meetings of clubs or organizations: Not on file    Relationship status: Not on file  . Intimate partner violence:     Fear of current or ex partner: Not on file    Emotionally abused: Not on file    Physically abused: Not on file    Forced sexual activity: Not on file  Other Topics Concern  . Not on file  Social History Narrative  . Not on file      Review of Systems  Constitutional: Positive for chills and fatigue. Negative for fever.  HENT: Positive for congestion, ear pain, postnasal drip, rhinorrhea, sinus pain and sore throat. Negative for voice change.   Respiratory: Positive for cough. Negative for wheezing.   Cardiovascular: Negative for chest pain and palpitations.  Gastrointestinal: Negative for nausea and vomiting.  Musculoskeletal: Negative.   Skin: Negative.   Allergic/Immunologic: Positive for environmental allergies.  Neurological: Positive for headaches.  Hematological: Positive for adenopathy.  Psychiatric/Behavioral: Negative.    Today's Vitals   07/14/18 1015  Temp: 97.6 F (36.4 C)  Weight: 174 lb (78.9 kg)  Height: 4\' 11"  (1.499 m)   Body mass index is 35.14 kg/m.   Observation/Objective:   The patient is alert and oriented. She is pleasant and answers all questions appropriately. Breathing is non-labored. She is in no acute distress at this time. The patient is nasally congested and appears as though she does not feel well.    Assessment/Plan: 1. Acute non-recurrent frontal sinusitis Start z-pack. Take as directed for 5 days. Rest and increase fluids. Take OTC medications to improve acute symptoms.  - azithromycin (ZITHROMAX) 250 MG tablet; z-pack - take as directed for 5 days for acute sinusitis (Patient not taking: Reported on 07/20/2018)  Dispense: 6 tablet; Refill: 0  2. Oral contraceptive use Renew prescription for birth control.  - TRI-LO-MARZIA 0.18/0.215/0.25 MG-25 MCG tab; Use as directed for 28 days  Dispense: 1 Package; Refill: 12  3. Essential hypertension Stable. Continue bp medication as prescribed   General Counseling: marilynne perezhernandez  understanding of the findings of today's phone visit and agrees with plan of treatment. I have discussed any further diagnostic evaluation that may be needed or ordered today. We also reviewed her medications today. she has been encouraged to call the office with any questions or concerns that should arise related to todays visit.  Rest and increase fluids. Continue using OTC medication to control symptoms.   This patient was seen by Vincent Gros FNP Collaboration with Dr Lyndon Code as a part of collaborative care agreement  Meds ordered this encounter  Medications  . azithromycin (ZITHROMAX) 250 MG tablet    Sig: z-pack - take as directed for 5 days for acute sinusitis    Dispense:  6 tablet    Refill:  0    Order Specific Question:   Supervising Provider  Answer:   Lyndon CodeKHAN, FOZIA M [[id number]]  . TRI-LO-MARZIA 0.18/0.215/0.25 MG-25 MCG tab    Sig: Use as directed for 28 days    Dispense:  1 Package    Refill:  12    Order Specific Question:   Supervising Provider    Answer:   Lyndon CodeKHAN, FOZIA M [[id number]]    Time spent: 1515 Minutes    Dr Lyndon CodeFozia M Khan Internal medicine

## 2018-07-20 ENCOUNTER — Other Ambulatory Visit: Payer: Self-pay

## 2018-07-20 ENCOUNTER — Ambulatory Visit: Payer: PRIVATE HEALTH INSURANCE | Admitting: Adult Health

## 2018-07-20 ENCOUNTER — Encounter: Payer: Self-pay | Admitting: Adult Health

## 2018-07-20 VITALS — Ht 59.0 in | Wt 174.0 lb

## 2018-07-20 DIAGNOSIS — H109 Unspecified conjunctivitis: Secondary | ICD-10-CM

## 2018-07-20 DIAGNOSIS — J0110 Acute frontal sinusitis, unspecified: Secondary | ICD-10-CM

## 2018-07-20 MED ORDER — POLYMYXIN B-TRIMETHOPRIM 10000-0.1 UNIT/ML-% OP SOLN: 2.0000 [drp] | OPHTHALMIC | 0 refills | Status: DC

## 2018-07-20 NOTE — Progress Notes (Signed)
[facility] [address] [place], [place] [address]  Internal MEDICINE  Telephone Visit  Patient Name: Lauren Marsh  [id number]  [id number]  Date of Service: 07/20/2018  I connected with the patient at 1214 by telephone and verified the patients identity using two identifiers.   I discussed the limitations, risks, security and privacy concerns of performing an evaluation and management service by telephone and the availability of in person appointments. I also discussed with the patient that there may be a patient responsible charge related to the service.  The patient expressed understanding and agrees to proceed.    Chief Complaint  Patient presents with  . Telephone Assessment  . Telephone Screen  . Sinusitis  . Conjunctivitis    HPI  Pt seen via video, reports she was treated for sinus infection recently and now has noticed redness and itching of her eyes.  She reports some mild pain and swelling to the eye.  She has completed her Z-Pak for sinus infection and those symptoms have resolved and now she started her eyes.   Current Medication: Outpatient Encounter Medications as of 07/20/2018  Medication Sig  . acetaminophen (TYLENOL) 500 MG tablet Take by mouth.  Marland Kitchen. amLODipine (NORVASC) 5 MG tablet TAKE 1 TABLET BY MOUTH EVERY DAY  . ibuprofen (ADVIL,MOTRIN) 600 MG tablet Take 1 tablet (600 mg total) by mouth every 8 (eight) hours as needed for headache.  . pantoprazole (PROTONIX) 40 MG tablet Take 1 tablet (40 mg total) by mouth daily. Take 1 tablet by mouth daily every morning  . phentermine (ADIPEX-P) 37.5 MG tablet Take 1 tablet (37.5 mg total) by mouth daily before breakfast.  . TRI-LO-MARZIA 0.18/0.215/0.25 MG-25 MCG tab Use as directed for 28 days  . azithromycin (ZITHROMAX) 250 MG tablet z-pack - take as directed for 5 days for acute sinusitis (Patient not taking: Reported on 07/20/2018)  . trimethoprim-polymyxin b (POLYTRIM) ophthalmic solution Place 2 drops  into the left eye every 4 (four) hours.   No facility-administered encounter medications on file as of 07/20/2018.     Surgical History: Past Surgical History:  Procedure Laterality Date  . EYE SURGERY      Medical History: Past Medical History:  Diagnosis Date  . GERD (gastroesophageal reflux disease)     Family History: Family History  Problem Relation Age of Onset  . Hypertension Mother   . Hypertension Father     Social History   Socioeconomic History  . Marital status: Married    Spouse name: Not on file  . Number of children: Not on file  . Years of education: Not on file  . Highest education level: Not on file  Occupational History  . Not on file  Social Needs  . Financial resource strain: Not on file  . Food insecurity:    Worry: Not on file    Inability: Not on file  . Transportation needs:    Medical: Not on file    Non-medical: Not on file  Tobacco Use  . Smoking status: Never Smoker  . Smokeless tobacco: Never Used  Substance and Sexual Activity  . Alcohol use: Yes    Comment: occasionally  . Drug use: Never  . Sexual activity: Not on file  Lifestyle  . Physical activity:    Days per week: Not on file    Minutes per session: Not on file  . Stress: Not on file  Relationships  . Social connections:    Talks on phone: Not on  file    Gets together: Not on file    Attends religious service: Not on file    Active member of club or organization: Not on file    Attends meetings of clubs or organizations: Not on file    Relationship status: Not on file  . Intimate partner violence:    Fear of current or ex partner: Not on file    Emotionally abused: Not on file    Physically abused: Not on file    Forced sexual activity: Not on file  Other Topics Concern  . Not on file  Social History Narrative  . Not on file      Review of Systems  Constitutional: Negative for chills, fatigue and unexpected weight change.  HENT: Negative for congestion,  rhinorrhea, sneezing and sore throat.   Eyes: Negative for photophobia, pain and redness.  Respiratory: Negative for cough, chest tightness and shortness of breath.   Cardiovascular: Negative for chest pain and palpitations.  Gastrointestinal: Negative for abdominal pain, constipation, diarrhea, nausea and vomiting.  Endocrine: Negative.   Genitourinary: Negative for dysuria and frequency.  Musculoskeletal: Negative for arthralgias, back pain, joint swelling and neck pain.  Skin: Negative for rash.  Allergic/Immunologic: Negative.   Neurological: Negative for tremors and numbness.  Hematological: Negative for adenopathy. Does not bruise/bleed easily.  Psychiatric/Behavioral: Negative for behavioral problems and sleep disturbance. The patient is not nervous/anxious.     Vital Signs: Ht 4\' 11"  (1.499 m)   Wt 174 lb (78.9 kg)   BMI 35.14 kg/m    Observation/Objective:  Well appearing, NAD noted. Speaking in full sentences.    Assessment/Plan: 1. Conjunctivitis of Left eye, unspecified conjunctivitis type Encourage patient to continue to use over-the-counter allergy medication and decongestant.  Use polytrim drops as prescribed. RTC if no better in 3-4 days. - trimethoprim-polymyxin b (POLYTRIM) ophthalmic solution; Place 2 drops into the left eye every 4 (four) hours.  Dispense: 10 mL; Refill: 0  2. Acute non-recurrent frontal sinusitis Resolved with zpak.  PT is feeling better.   General Counseling: natalie mceuen understanding of the findings of today's phone visit and agrees with plan of treatment. I have discussed any further diagnostic evaluation that may be needed or ordered today. We also reviewed her medications today. she has been encouraged to call the office with any questions or concerns that should arise related to todays visit.    No orders of the defined types were placed in this encounter.   Meds ordered this encounter  Medications  .  trimethoprim-polymyxin b (POLYTRIM) ophthalmic solution    Sig: Place 2 drops into the left eye every 4 (four) hours.    Dispense:  10 mL    Refill:  0    Time spent: [place] AGNP-C Internal medicine

## 2018-07-21 DIAGNOSIS — J0110 Acute frontal sinusitis, unspecified: Secondary | ICD-10-CM | POA: Insufficient documentation

## 2018-07-21 DIAGNOSIS — Z3041 Encounter for surveillance of contraceptive pills: Secondary | ICD-10-CM | POA: Insufficient documentation

## 2018-07-27 ENCOUNTER — Other Ambulatory Visit: Payer: Self-pay

## 2018-07-27 DIAGNOSIS — R030 Elevated blood-pressure reading, without diagnosis of hypertension: Secondary | ICD-10-CM

## 2018-07-27 MED ORDER — AMLODIPINE BESYLATE 5 MG PO TABS: 5.0000 mg | ORAL_TABLET | Freq: Every day | ORAL | 1 refills | Status: DC

## 2018-08-26 ENCOUNTER — Ambulatory Visit: Payer: PRIVATE HEALTH INSURANCE | Admitting: Nurse Practitioner

## 2018-08-26 ENCOUNTER — Encounter: Payer: Self-pay | Admitting: Nurse Practitioner

## 2018-08-26 ENCOUNTER — Other Ambulatory Visit: Payer: Self-pay

## 2018-08-26 VITALS — BP 136/84 | HR 86 | Resp 16 | Ht 59.0 in | Wt 178.0 lb

## 2018-08-26 DIAGNOSIS — Z1239 Encounter for other screening for malignant neoplasm of breast: Secondary | ICD-10-CM | POA: Diagnosis not present

## 2018-08-26 DIAGNOSIS — Z0001 Encounter for general adult medical examination with abnormal findings: Secondary | ICD-10-CM | POA: Diagnosis not present

## 2018-08-26 DIAGNOSIS — R300 Dysuria: Secondary | ICD-10-CM

## 2018-08-26 DIAGNOSIS — Z6833 Body mass index (BMI) 33.0-33.9, adult: Secondary | ICD-10-CM

## 2018-08-26 DIAGNOSIS — I10 Essential (primary) hypertension: Secondary | ICD-10-CM

## 2018-08-26 DIAGNOSIS — E6609 Other obesity due to excess calories: Secondary | ICD-10-CM | POA: Diagnosis not present

## 2018-08-26 MED ORDER — PHENTERMINE HCL 37.5 MG PO TABS: 37.5000 mg | ORAL_TABLET | Freq: Every day | ORAL | 0 refills | Status: DC

## 2018-08-26 NOTE — Progress Notes (Signed)
[facility] [address] [place], [place] [address]  Internal MEDICINE  Office Visit Note  Patient Name: Lauren Marsh  [id number]  [id number]  Date of Service: 09/05/2018   Pt is here for routine health maintenance examination  Chief Complaint  Patient presents with  . Annual Exam    pt want to wait for her pap closer to december 2020  . Gastroesophageal Reflux     The patient Is here for health maintenance exam. Blood pressure is well controlled. She takes phentermine, seldom, to help curb appetite and help with weight management. She has maintained her weight. Has trouble with sleeping when she takes the phentermine whichis why she takes this so infrequently. She has no other negative side effects associated with taking phentermine. Blood pressure is well controlled.     Current Medication: Outpatient Encounter Medications as of 08/26/2018  Medication Sig  . acetaminophen (TYLENOL) 500 MG tablet Take by mouth.  Marland Kitchen. amLODipine (NORVASC) 5 MG tablet Take 1 tablet (5 mg total) by mouth daily.  Marland Kitchen. ibuprofen (ADVIL,MOTRIN) 600 MG tablet Take 1 tablet (600 mg total) by mouth every 8 (eight) hours as needed for headache.  . pantoprazole (PROTONIX) 40 MG tablet Take 1 tablet (40 mg total) by mouth daily. Take 1 tablet by mouth daily every morning  . phentermine (ADIPEX-P) 37.5 MG tablet Take 1 tablet (37.5 mg total) by mouth daily before breakfast.  . TRI-LO-MARZIA 0.18/0.215/0.25 MG-25 MCG tab Use as directed for 28 days  . [DISCONTINUED] phentermine (ADIPEX-P) 37.5 MG tablet Take 1 tablet (37.5 mg total) by mouth daily before breakfast.  . [DISCONTINUED] trimethoprim-polymyxin b (POLYTRIM) ophthalmic solution Place 2 drops into the left eye every 4 (four) hours.  . [DISCONTINUED] azithromycin (ZITHROMAX) 250 MG tablet z-pack - take as directed for 5 days for acute sinusitis (Patient not taking: Reported on 07/20/2018)   No facility-administered encounter medications on  file as of 08/26/2018.     Surgical History: Past Surgical History:  Procedure Laterality Date  . EYE SURGERY      Medical History: Past Medical History:  Diagnosis Date  . GERD (gastroesophageal reflux disease)     Family History: Family History  Problem Relation Age of Onset  . Hypertension Mother   . Hypertension Father       Review of Systems  Constitutional: Negative for chills, fatigue and unexpected weight change.       Four pound weight gain since her last visit.   HENT: Negative for congestion, rhinorrhea, sneezing and sore throat.   Respiratory: Negative for cough, chest tightness and shortness of breath.   Cardiovascular: Negative for chest pain and palpitations.  Gastrointestinal: Negative for abdominal pain, constipation, diarrhea, nausea and vomiting.  Endocrine: Negative for cold intolerance, heat intolerance, polydipsia and polyuria.  Genitourinary: Negative for dysuria, frequency, hematuria and urgency.  Musculoskeletal: Negative for arthralgias, back pain, joint swelling and neck pain.  Skin: Negative for rash.  Allergic/Immunologic: Negative for environmental allergies.  Neurological: Negative for dizziness, tremors, numbness and headaches.  Hematological: Negative for adenopathy. Does not bruise/bleed easily.  Psychiatric/Behavioral: Negative for behavioral problems and sleep disturbance. The patient is not nervous/anxious.      Today's Vitals   08/26/18 1524  BP: 136/84  Pulse: 86  Resp: 16  SpO2: 97%  Weight: 178 lb (80.7 kg)  Height: 4\' 11"  (1.499 m)   Body mass index is 35.95 kg/m.  Physical Exam Vitals signs and nursing note reviewed.  Constitutional:  General: She is not in acute distress.    Appearance: She is well-developed. She is obese. She is not diaphoretic.  HENT:     Head: Normocephalic and atraumatic.     Mouth/Throat:     Pharynx: No oropharyngeal exudate.  Eyes:     Pupils: Pupils are equal, round, and reactive to  light.  Neck:     Musculoskeletal: Normal range of motion and neck supple.     Vascular: No carotid bruit.  Cardiovascular:     Rate and Rhythm: Normal rate and regular rhythm.     Pulses: Normal pulses.     Heart sounds: Normal heart sounds. No murmur. No friction rub. No gallop.   Pulmonary:     Effort: Pulmonary effort is normal. No respiratory distress.     Breath sounds: Normal breath sounds. No wheezing or rales.  Chest:     Chest wall: No tenderness.     Breasts:        Right: Normal. No swelling, bleeding, inverted nipple, mass, nipple discharge, skin change or tenderness.        Left: Normal. No swelling, bleeding, inverted nipple, mass, nipple discharge, skin change or tenderness.  Abdominal:     General: Bowel sounds are normal.     Palpations: Abdomen is soft.  Musculoskeletal:        General: No tenderness or deformity.  Skin:    General: Skin is warm and dry.  Neurological:     Mental Status: She is alert and oriented to person, place, and time.     Cranial Nerves: No cranial nerve deficit.  Psychiatric:        Behavior: Behavior normal.        Thought Content: Thought content normal.        Judgment: Judgment normal.     LABS: Recent Results (from the past 2160 hour(s))  UA/M w/rflx Culture, Routine     Status: None   Collection Time: 08/26/18  3:24 PM   Specimen: Urine   URINE  Result Value Ref Range   Specific Gravity, UA 1.012 1.005 - 1.030   pH, UA 6.0 5.0 - 7.5   Color, UA Yellow Yellow   Appearance Ur Clear Clear   Leukocytes,UA Negative Negative   Protein,UA Negative Negative/Trace   Glucose, UA Negative Negative   Ketones, UA Negative Negative   RBC, UA Negative Negative   Bilirubin, UA Negative Negative   Urobilinogen, Ur 1.0 0.2 - 1.0 mg/dL   Nitrite, UA Negative Negative   Microscopic Examination Comment     Comment: Microscopic follows if indicated.   Microscopic Examination See below:     Comment: Microscopic was indicated and was  performed.   Urinalysis Reflex Comment     Comment: This specimen will not reflex to a Urine Culture.  Microscopic Examination     Status: None   Collection Time: 08/26/18  3:24 PM   URINE  Result Value Ref Range   WBC, UA None seen 0 - 5 /hpf   RBC 0-2 0 - 2 /hpf   Epithelial Cells (non renal) 0-10 0 - 10 /hpf   Casts None seen None seen /lpf   Bacteria, UA None seen None seen/Few  Comprehensive metabolic panel     Status: Abnormal   Collection Time: 08/30/18  7:58 AM  Result Value Ref Range   Glucose 92 65 - 99 mg/dL   BUN 12 6 - 24 mg/dL   Creatinine, Ser 0.980.78 0.57 - 1.00 mg/dL  GFR calc non Af Amer 86 >59 mL/min/1.73   GFR calc Af Amer 100 >59 mL/min/1.73   BUN/Creatinine Ratio 15 9 - 23   Sodium 142 134 - 144 mmol/L   Potassium 5.1 3.5 - 5.2 mmol/L   Chloride 106 96 - 106 mmol/L   CO2 18 (L) 20 - 29 mmol/L   Calcium 9.3 8.7 - 10.2 mg/dL   Total Protein 6.7 6.0 - 8.5 g/dL   Albumin 4.4 3.8 - 4.9 g/dL   Globulin, Total 2.3 1.5 - 4.5 g/dL   Albumin/Globulin Ratio 1.9 1.2 - 2.2   Bilirubin Total 0.5 0.0 - 1.2 mg/dL   Alkaline Phosphatase 51 39 - 117 IU/L   AST 16 0 - 40 IU/L   ALT 18 0 - 32 IU/L  CBC     Status: None   Collection Time: 08/30/18  7:58 AM  Result Value Ref Range   WBC 6.8 3.4 - 10.8 x10E3/uL   RBC 4.39 3.77 - 5.28 x10E6/uL   Hemoglobin 13.1 11.1 - 15.9 g/dL   Hematocrit 39.7 34.0 - 46.6 %   MCV 90 79 - 97 fL   MCH 29.8 26.6 - 33.0 pg   MCHC 33.0 31.5 - 35.7 g/dL   RDW 13.1 11.7 - 15.4 %   Platelets 357 150 - 450 x10E3/uL  Lipid Panel w/o Chol/HDL Ratio     Status: Abnormal   Collection Time: 08/30/18  7:58 AM  Result Value Ref Range   Cholesterol, Total 209 (H) 100 - 199 mg/dL   Triglycerides 89 0 - 149 mg/dL   HDL 78 >39 mg/dL   VLDL Cholesterol Cal 18 5 - 40 mg/dL   LDL Calculated 113 (H) 0 - 99 mg/dL  T4, free     Status: None   Collection Time: 08/30/18  7:58 AM  Result Value Ref Range   Free T4 1.12 0.82 - 1.77 ng/dL  TSH     Status:  None   Collection Time: 08/30/18  7:58 AM  Result Value Ref Range   TSH 1.960 0.450 - 4.500 uIU/mL  VITAMIN D 25 Hydroxy (Vit-D Deficiency, Fractures)     Status: None   Collection Time: 08/30/18  7:58 AM  Result Value Ref Range   Vit D, 25-Hydroxy 30.2 30.0 - 100.0 ng/mL    Comment: Vitamin D deficiency has been defined by the [employer] and an Endocrine Society practice guideline as a level of serum 25-OH vitamin D less than 20 ng/mL (1,2). The Endocrine Society went on to further define vitamin D insufficiency as a level between 21 and 29 ng/mL (2). 1. IOM (Institute of Medicine). 2010. Dietary reference    intakes for calcium and D. [place]: The    [employer]. 2. Holick MF, Binkley Longton, Bischoff-Ferrari HA, et al.    Evaluation, treatment, and prevention of vitamin D    deficiency: an Endocrine Society clinical practice    guideline. JCEM. 2011 Jul; 96(7):1911-30.     Assessment/Plan: 1. Encounter for general adult medical examination with abnormal findings Annual health maintenance exam today.   2. Essential hypertension Stable. Continue bp medication as prescribed   3. Class 1 obesity due to excess calories without serious comorbidity with body mass index (BMI) of 33.0 to 33.9 in adult May continue phentermine 37.5mg  tablets daily as needed. Limit calorie intake to 1200-1500 calories per day. Incorporate exercise into her daily routine.  - phentermine (ADIPEX-P) 37.5 MG tablet; Take 1 tablet (37.5 mg total) by  mouth daily before breakfast.  Dispense: 30 tablet; Refill: 0  4. Screening for breast cancer - MM DIGITAL SCREENING BILATERAL; Future  5. Dysuria - UA/M w/rflx Culture, Routine  General Counseling: Lauren Pandyatricia verbalizes understanding of the findings of todays visit and agrees with plan of treatment. I have discussed any further diagnostic evaluation that may be needed or ordered today. We also reviewed her medications today. she has  been encouraged to call the office with any questions or concerns that should arise related to todays visit.    Counseling:  Hypertension Counseling:   The following hypertensive lifestyle modification were recommended and discussed:  1. Limiting alcohol intake to less than 1 oz/day of ethanol:(24 oz of beer or 8 oz of wine or 2 oz of 100-proof whiskey). 2. Take baby ASA 81 mg daily. 3. Importance of regular aerobic exercise and losing weight. 4. Reduce dietary saturated fat and cholesterol intake for overall cardiovascular health. 5. Maintaining adequate dietary potassium, calcium, and magnesium intake. 6. Regular monitoring of the blood pressure. 7. Reduce sodium intake to less than 100 mmol/day (less than 2.3 gm of sodium or less than 6 gm of sodium choride)   This patient was seen by Vincent GrosHeather Armoni Kludt FNP Collaboration with Dr Lyndon CodeFozia M Khan as a part of collaborative care agreement  Orders Placed This Encounter  Procedures  . Microscopic Examination  . MM DIGITAL SCREENING BILATERAL  . UA/M w/rflx Culture, Routine    Meds ordered this encounter  Medications  . phentermine (ADIPEX-P) 37.5 MG tablet    Sig: Take 1 tablet (37.5 mg total) by mouth daily before breakfast.    Dispense:  30 tablet    Refill:  0    Order Specific Question:   Supervising Provider    Answer:   Lyndon CodeKHAN, FOZIA M [[id number]]    Time spent: 2030 Minutes      Lyndon CodeFozia M Khan, MD  Internal Medicine

## 2018-08-27 LAB — UA/M W/RFLX CULTURE, ROUTINE
Bilirubin, UA: NEGATIVE
Glucose, UA: NEGATIVE
Ketones, UA: NEGATIVE
Leukocytes,UA: NEGATIVE
Nitrite, UA: NEGATIVE
Protein,UA: NEGATIVE
RBC, UA: NEGATIVE
Specific Gravity, UA: 1.012 (ref 1.005–1.030)
Urobilinogen, Ur: 1 mg/dL (ref 0.2–1.0)
pH, UA: 6 (ref 5.0–7.5)

## 2018-08-27 LAB — MICROSCOPIC EXAMINATION
Bacteria, UA: NONE SEEN
Casts: NONE SEEN /lpf
WBC, UA: NONE SEEN /hpf (ref 0–5)

## 2018-08-30 ENCOUNTER — Other Ambulatory Visit: Payer: Self-pay | Admitting: Nurse Practitioner

## 2018-08-31 LAB — COMPREHENSIVE METABOLIC PANEL
ALT: 18 IU/L (ref 0–32)
AST: 16 IU/L (ref 0–40)
Albumin/Globulin Ratio: 1.9 (ref 1.2–2.2)
Albumin: 4.4 g/dL (ref 3.8–4.9)
Alkaline Phosphatase: 51 IU/L (ref 39–117)
BUN/Creatinine Ratio: 15 (ref 9–23)
BUN: 12 mg/dL (ref 6–24)
Bilirubin Total: 0.5 mg/dL (ref 0.0–1.2)
CO2: 18 mmol/L — ABNORMAL LOW (ref 20–29)
Calcium: 9.3 mg/dL (ref 8.7–10.2)
Chloride: 106 mmol/L (ref 96–106)
Creatinine, Ser: 0.78 mg/dL (ref 0.57–1.00)
GFR calc Af Amer: 100 mL/min/{1.73_m2} (ref >59)
GFR calc non Af Amer: 86 mL/min/{1.73_m2} (ref >59)
Globulin, Total: 2.3 g/dL (ref 1.5–4.5)
Glucose: 92 mg/dL (ref 65–99)
Potassium: 5.1 mmol/L (ref 3.5–5.2)
Sodium: 142 mmol/L (ref 134–144)
Total Protein: 6.7 g/dL (ref 6.0–8.5)

## 2018-08-31 LAB — CBC
Hematocrit: 39.7 % (ref 34.0–46.6)
Hemoglobin: 13.1 g/dL (ref 11.1–15.9)
MCH: 29.8 pg (ref 26.6–33.0)
MCHC: 33 g/dL (ref 31.5–35.7)
MCV: 90 fL (ref 79–97)
Platelets: 357 10*3/uL (ref 150–450)
RBC: 4.39 x10E6/uL (ref 3.77–5.28)
RDW: 13.1 % (ref 11.7–15.4)
WBC: 6.8 10*3/uL (ref 3.4–10.8)

## 2018-08-31 LAB — T4, FREE: Free T4: 1.12 ng/dL (ref 0.82–1.77)

## 2018-08-31 LAB — LIPID PANEL W/O CHOL/HDL RATIO
Cholesterol, Total: 209 mg/dL — ABNORMAL HIGH (ref 100–199)
HDL: 78 mg/dL (ref >39)
LDL Calculated: 113 mg/dL — ABNORMAL HIGH (ref 0–99)
Triglycerides: 89 mg/dL (ref 0–149)
VLDL Cholesterol Cal: 18 mg/dL (ref 5–40)

## 2018-08-31 LAB — VITAMIN D 25 HYDROXY (VIT D DEFICIENCY, FRACTURES): Vit D, 25-Hydroxy: 30.2 ng/mL (ref 30.0–100.0)

## 2018-08-31 LAB — TSH: TSH: 1.96 u[IU]/mL (ref 0.450–4.500)

## 2018-09-05 DIAGNOSIS — E6609 Other obesity due to excess calories: Secondary | ICD-10-CM | POA: Insufficient documentation

## 2018-09-22 NOTE — Progress Notes (Signed)
Overall good labs. Discuss at next visit 11/01/2018

## 2018-11-01 ENCOUNTER — Ambulatory Visit: Payer: Self-pay | Admitting: Adult Health

## 2018-11-24 ENCOUNTER — Other Ambulatory Visit: Payer: Self-pay

## 2018-11-24 MED ORDER — PANTOPRAZOLE SODIUM 40 MG PO TBEC: 40.0000 mg | DELAYED_RELEASE_TABLET | Freq: Every day | ORAL | 1 refills | Status: DC

## 2018-12-21 ENCOUNTER — Encounter: Payer: Self-pay | Admitting: Nurse Practitioner

## 2018-12-21 ENCOUNTER — Ambulatory Visit: Payer: 59 | Admitting: Nurse Practitioner

## 2018-12-21 ENCOUNTER — Other Ambulatory Visit: Payer: Self-pay

## 2018-12-21 VITALS — BP 133/90 | HR 98 | Temp 97.8°F | Resp 16 | Ht 59.0 in | Wt 185.4 lb

## 2018-12-21 DIAGNOSIS — K580 Irritable bowel syndrome with diarrhea: Secondary | ICD-10-CM

## 2018-12-21 DIAGNOSIS — Z124 Encounter for screening for malignant neoplasm of cervix: Secondary | ICD-10-CM

## 2018-12-21 DIAGNOSIS — E7841 Elevated Lipoprotein(a): Secondary | ICD-10-CM

## 2018-12-21 DIAGNOSIS — R1084 Generalized abdominal pain: Secondary | ICD-10-CM | POA: Diagnosis not present

## 2018-12-21 DIAGNOSIS — I10 Essential (primary) hypertension: Secondary | ICD-10-CM | POA: Diagnosis not present

## 2018-12-21 MED ORDER — VIBERZI 75 MG PO TABS: 75.0000 mg | ORAL_TABLET | Freq: Two times a day (BID) | ORAL | 1 refills | Status: DC | PRN

## 2018-12-21 NOTE — Progress Notes (Signed)
[facility] [address] [place], [place] [address]  Internal MEDICINE  Office Visit Note  Patient Name: Lauren Marsh  [id number]  [id number]  Date of Service: 01/07/2019  Chief Complaint  Patient presents with  . Follow-up    weight management, has not been taking weight loss medication regularly  and is not as effective as it was, sometimes keeps her awak but thinks it may be due to stress as well  . Gynecologic Exam  . Hypertension    The patient is here for follow up. Has stooped taking phentermine.  -increased episodes of diarrhea and bloating and gas. Still has gallbladder. Colonoscopy in 2016 was unremarkable.  -pap smear. Had to hold on day of annual physical due to menstrual cycle. Will get today.  -labs done prior to this visit indicated mild elevation of LDL and total cholesterol  -screening mammogram done 09/30/2018 was negative       Current Medication: Outpatient Encounter Medications as of 12/21/2018  Medication Sig  . acetaminophen (TYLENOL) 500 MG tablet Take by mouth.  Marland Kitchen. amLODipine (NORVASC) 5 MG tablet Take 1 tablet (5 mg total) by mouth daily.  Marland Kitchen. ibuprofen (ADVIL,MOTRIN) 600 MG tablet Take 1 tablet (600 mg total) by mouth every 8 (eight) hours as needed for headache.  . pantoprazole (PROTONIX) 40 MG tablet Take 1 tablet (40 mg total) by mouth daily. Take 1 tablet by mouth daily every morning  . phentermine (ADIPEX-P) 37.5 MG tablet Take 1 tablet (37.5 mg total) by mouth daily before breakfast.  . TRI-LO-MARZIA 0.18/0.215/0.25 MG-25 MCG tab Use as directed for 28 days  . Eluxadoline (VIBERZI) 75 MG TABS Take 75 mg by mouth 2 (two) times daily as needed.   No facility-administered encounter medications on file as of 12/21/2018.     Surgical History: Past Surgical History:  Procedure Laterality Date  . EYE SURGERY      Medical History: Past Medical History:  Diagnosis Date  . GERD (gastroesophageal reflux disease)     Family  History: Family History  Problem Relation Age of Onset  . Hypertension Mother   . Hypertension Father     Social History   Socioeconomic History  . Marital status: Married    Spouse name: Not on file  . Number of children: Not on file  . Years of education: Not on file  . Highest education level: Not on file  Occupational History  . Not on file  Social Needs  . Financial resource strain: Not on file  . Food insecurity    Worry: Not on file    Inability: Not on file  . Transportation needs    Medical: Not on file    Non-medical: Not on file  Tobacco Use  . Smoking status: Never Smoker  . Smokeless tobacco: Never Used  Substance and Sexual Activity  . Alcohol use: Yes    Comment: occasionally  . Drug use: Never  . Sexual activity: Not on file  Lifestyle  . Physical activity    Days per week: Not on file    Minutes per session: Not on file  . Stress: Not on file  Relationships  . Social Musicianconnections    Talks on phone: Not on file    Gets together: Not on file    Attends religious service: Not on file    Active member of club or organization: Not on file    Attends meetings of clubs or organizations: Not on file    Relationship  status: Not on file  . Intimate partner violence    Fear of current or ex partner: Not on file    Emotionally abused: Not on file    Physically abused: Not on file    Forced sexual activity: Not on file  Other Topics Concern  . Not on file  Social History Narrative  . Not on file      Review of Systems  Constitutional: Negative for activity change, chills, fatigue and unexpected weight change.  HENT: Negative for congestion, postnasal drip, rhinorrhea, sneezing and sore throat.   Respiratory: Negative for cough, chest tightness, shortness of breath and wheezing.   Cardiovascular: Negative for chest pain and palpitations.  Gastrointestinal: Positive for abdominal pain and diarrhea. Negative for constipation, nausea and vomiting.        Abdominal cramping and diarrhea which is intermittent.   Endocrine: Negative for cold intolerance, heat intolerance, polydipsia and polyuria.  Genitourinary: Negative for dysuria, frequency, pelvic pain, vaginal bleeding and vaginal discharge.  Musculoskeletal: Negative for arthralgias, back pain, joint swelling and neck pain.  Skin: Negative for rash.  Allergic/Immunologic: Negative for environmental allergies.  Neurological: Negative for dizziness, tremors, numbness and headaches.  Hematological: Negative for adenopathy. Does not bruise/bleed easily.  Psychiatric/Behavioral: Negative for behavioral problems (Depression), sleep disturbance and suicidal ideas. The patient is not nervous/anxious.    Today's Vitals   12/21/18 1129  BP: 133/90  Pulse: 98  Resp: 16  Temp: 97.8 F (36.6 C)  SpO2: 98%  Weight: 185 lb 6.4 oz (84.1 kg)  Height: 4\' 11"  (1.499 m)   Body mass index is 37.45 kg/m.  Physical Exam Vitals signs and nursing note reviewed.  Constitutional:      General: She is not in acute distress.    Appearance: Normal appearance. She is well-developed. She is not diaphoretic.  HENT:     Head: Normocephalic and atraumatic.     Mouth/Throat:     Pharynx: No oropharyngeal exudate.  Eyes:     Extraocular Movements: Extraocular movements intact.     Pupils: Pupils are equal, round, and reactive to light.  Neck:     Musculoskeletal: Normal range of motion and neck supple.     Thyroid: No thyromegaly.     Vascular: No JVD.     Trachea: No tracheal deviation.  Cardiovascular:     Rate and Rhythm: Normal rate and regular rhythm.     Heart sounds: Normal heart sounds. No murmur. No friction rub. No gallop.   Pulmonary:     Effort: Pulmonary effort is normal. No respiratory distress.     Breath sounds: Normal breath sounds. No wheezing or rales.  Chest:     Chest wall: No tenderness.  Abdominal:     General: Bowel sounds are normal.     Palpations: Abdomen is soft.      Tenderness: There is generalized abdominal tenderness.  Genitourinary:    General: Normal vulva.     Exam position: Supine.     Labia:        Right: No tenderness or lesion.        Left: No tenderness or lesion.      Vagina: Normal. No vaginal discharge, erythema, tenderness or bleeding.     Cervix: No cervical motion tenderness, discharge, friability or erythema.     Uterus: Normal. Not enlarged.      Adnexa: Right adnexa normal and left adnexa normal.       Right: No tenderness.  Left: No tenderness.       Comments: No tenderness, masses, or organomeglay present during bimanual exam . Musculoskeletal: Normal range of motion.  Lymphadenopathy:     Cervical: No cervical adenopathy.     Lower Body: No right inguinal adenopathy. No left inguinal adenopathy.  Skin:    General: Skin is warm and dry.  Neurological:     Mental Status: She is alert and oriented to person, place, and time.     Cranial Nerves: No cranial nerve deficit.  Psychiatric:        Mood and Affect: Mood normal.        Behavior: Behavior normal.        Thought Content: Thought content normal.        Judgment: Judgment normal.   Assessment/Plan: 1. Essential hypertension Stable. Continue bp medication as prescribed   2. Generalized abdominal pain Will get ultrasound of the abdomen for further evaluation.  - US Abdomen Complete; Future  3. Irritable bowel syndrome with diarrhea Trial of viberzi 75mg  twice daily as needed for belly pain and diarrhea. - Eluxadoline (VIBERZI) 75 MG TABS; Take 75 mg by mouth 2 (two) times daily as needed.  Dispense: 60 tablet; Refill: 1  4. Elevated lipoprotein(a) Reviewed labs. Mild elevation of LDL and total cholesterol. Prudent diet reviewed and written information provided.   5. Routine cervical smear - Pap IG and HPV (high risk) DNA detection  General Counseling: ellie bryand understanding of the findings of todays visit and agrees with plan of treatment. I  have discussed any further diagnostic evaluation that may be needed or ordered today. We also reviewed her medications today. she has been encouraged to call the office with any questions or concerns that should arise related to todays visit.  This patient was seen by [name] FNP Collaboration with Dr Vincent Gros as a part of collaborative care agreement  Orders Placed This Encounter  Procedures  . Lyndon Code Abdomen Complete    Meds ordered this encounter  Medications  . Eluxadoline (VIBERZI) 75 MG TABS    Sig: Take 75 mg by mouth 2 (two) times daily as needed.    Dispense:  60 tablet    Refill:  1    Patient to have copay assistance card.    Order Specific Question:   Supervising Provider    Answer:   [place] Lyndon Code    Time spent: 47 Minutes      Dr 26 Internal medicine

## 2018-12-29 ENCOUNTER — Telehealth: Payer: Self-pay

## 2018-12-29 LAB — PAP IG AND HPV HIGH-RISK: HPV, high-risk: NEGATIVE

## 2018-12-29 NOTE — Telephone Encounter (Signed)
Pt was notified.  

## 2018-12-29 NOTE — Progress Notes (Signed)
Please let the patient know that her pap smear is normal. Thanks.

## 2018-12-29 NOTE — Telephone Encounter (Signed)
-----   Message from Ronnell Freshwater, NP sent at 12/29/2018  1:48 PM EST ----- Please let the patient know that her pap smear is normal. Thanks.

## 2019-01-07 DIAGNOSIS — K580 Irritable bowel syndrome with diarrhea: Secondary | ICD-10-CM | POA: Insufficient documentation

## 2019-01-07 DIAGNOSIS — R1084 Generalized abdominal pain: Secondary | ICD-10-CM | POA: Insufficient documentation

## 2019-01-07 DIAGNOSIS — Z124 Encounter for screening for malignant neoplasm of cervix: Secondary | ICD-10-CM | POA: Insufficient documentation

## 2019-01-07 DIAGNOSIS — E7841 Elevated Lipoprotein(a): Secondary | ICD-10-CM | POA: Insufficient documentation

## 2019-01-12 ENCOUNTER — Telehealth: Payer: Self-pay

## 2019-01-12 NOTE — Telephone Encounter (Signed)
Confirmed patient ultrasound 01/14/19. Lauren Marsh °

## 2019-01-14 ENCOUNTER — Ambulatory Visit: Payer: 59

## 2019-01-14 ENCOUNTER — Other Ambulatory Visit: Payer: Self-pay

## 2019-01-14 DIAGNOSIS — R1084 Generalized abdominal pain: Secondary | ICD-10-CM

## 2019-01-18 ENCOUNTER — Telehealth: Payer: Self-pay

## 2019-01-18 NOTE — Telephone Encounter (Signed)
Confirmed appointment with patient. klh °

## 2019-01-20 ENCOUNTER — Ambulatory Visit (INDEPENDENT_AMBULATORY_CARE_PROVIDER_SITE_OTHER): Payer: 59 | Admitting: Adult Health

## 2019-01-20 ENCOUNTER — Encounter: Payer: Self-pay | Admitting: Adult Health

## 2019-01-20 ENCOUNTER — Other Ambulatory Visit: Payer: Self-pay

## 2019-01-20 VITALS — BP 148/73 | HR 78 | Temp 97.6°F | Resp 16 | Wt 190.0 lb

## 2019-01-20 DIAGNOSIS — K760 Fatty (change of) liver, not elsewhere classified: Secondary | ICD-10-CM | POA: Diagnosis not present

## 2019-01-20 DIAGNOSIS — I10 Essential (primary) hypertension: Secondary | ICD-10-CM

## 2019-01-20 DIAGNOSIS — K582 Mixed irritable bowel syndrome: Secondary | ICD-10-CM | POA: Diagnosis not present

## 2019-01-20 DIAGNOSIS — Z6833 Body mass index (BMI) 33.0-33.9, adult: Secondary | ICD-10-CM

## 2019-01-20 DIAGNOSIS — E6609 Other obesity due to excess calories: Secondary | ICD-10-CM

## 2019-01-20 MED ORDER — DICYCLOMINE HCL 10 MG PO CAPS: 10.0000 mg | ORAL_CAPSULE | Freq: Two times a day (BID) | ORAL | 0 refills | Status: DC | PRN

## 2019-01-20 NOTE — Progress Notes (Signed)
[facility] [address] [place], [place] [address]  Internal MEDICINE  Office Visit Note  Patient Name: Lauren Marsh  [id number]  [id number]  Date of Service: 01/20/2019  Chief Complaint  Patient presents with  . Medical Management of Chronic Issues    [place] follow up , talk about weight gain     HPI  Pt is here for follow up on abdominal ultrasound.  She has been having abdominal discomfort, intestinal gas, and bloating.  She also reports she has been having intermittent loose bowels.  She has been off phentermine for nearly two months because she initially thought that was the cause of her issues. She reports having to stop at times, at the truck stop on the way to work in the morning to have a BM.  She does not describe diarrhea, but reports her stools are urgent, and loose.        Current Medication: Outpatient Encounter Medications as of 01/20/2019  Medication Sig  . acetaminophen (TYLENOL) 500 MG tablet Take by mouth.  Marland Kitchen amLODipine (NORVASC) 5 MG tablet Take 1 tablet (5 mg total) by mouth daily.  . Eluxadoline (VIBERZI) 75 MG TABS Take 75 mg by mouth 2 (two) times daily as needed.  Marland Kitchen ibuprofen (ADVIL,MOTRIN) 600 MG tablet Take 1 tablet (600 mg total) by mouth every 8 (eight) hours as needed for headache.  . pantoprazole (PROTONIX) 40 MG tablet Take 1 tablet (40 mg total) by mouth daily. Take 1 tablet by mouth daily every morning  . TRI-LO-MARZIA 0.18/0.215/0.25 MG-25 MCG tab Use as directed for 28 days  . dicyclomine (BENTYL) 10 MG capsule Take 1 capsule (10 mg total) by mouth 2 (two) times daily as needed for spasms.  . [DISCONTINUED] phentermine (ADIPEX-P) 37.5 MG tablet Take 1 tablet (37.5 mg total) by mouth daily before breakfast. (Patient not taking: Reported on 01/20/2019)   No facility-administered encounter medications on file as of 01/20/2019.    Surgical History: Past Surgical History:  Procedure Laterality Date  . EYE SURGERY      Medical  History: Past Medical History:  Diagnosis Date  . GERD (gastroesophageal reflux disease)     Family History: Family History  Problem Relation Age of Onset  . Hypertension Mother   . Hypertension Father     Social History   Socioeconomic History  . Marital status: Married    Spouse name: Not on file  . Number of children: Not on file  . Years of education: Not on file  . Highest education level: Not on file  Occupational History  . Not on file  Tobacco Use  . Smoking status: Never Smoker  . Smokeless tobacco: Never Used  Substance and Sexual Activity  . Alcohol use: Yes    Comment: occasionally  . Drug use: Never  . Sexual activity: Not on file  Other Topics Concern  . Not on file  Social History Narrative  . Not on file   Social Determinants of Health   Financial Resource Strain:   . Difficulty of Paying Living Expenses: Not on file  Food Insecurity:   . Worried About Programme researcher, broadcasting/film/video in the Last Year: Not on file  . Ran Out of Food in the Last Year: Not on file  Transportation Needs:   . Lack of Transportation (Medical): Not on file  . Lack of Transportation (Non-Medical): Not on file  Physical Activity:   . Days of Exercise per Week: Not on file  . Minutes  of Exercise per Session: Not on file  Stress:   . Feeling of Stress : Not on file  Social Connections:   . Frequency of Communication with Friends and Family: Not on file  . Frequency of Social Gatherings with Friends and Family: Not on file  . Attends Religious Services: Not on file  . Active Member of Clubs or Organizations: Not on file  . Attends BankerClub or Organization Meetings: Not on file  . Marital Status: Not on file  Intimate Partner Violence:   . Fear of Current or Ex-Partner: Not on file  . Emotionally Abused: Not on file  . Physically Abused: Not on file  . Sexually Abused: Not on file      Review of Systems  Constitutional: Negative for chills, fatigue and unexpected weight change.   HENT: Negative for congestion, rhinorrhea, sneezing and sore throat.   Eyes: Negative for photophobia, pain and redness.  Respiratory: Negative for cough, chest tightness and shortness of breath.   Cardiovascular: Negative for chest pain and palpitations.  Gastrointestinal: Negative for abdominal pain, constipation, diarrhea, nausea and vomiting.  Endocrine: Negative.   Genitourinary: Negative for dysuria and frequency.  Musculoskeletal: Negative for arthralgias, back pain, joint swelling and neck pain.  Skin: Negative for rash.  Allergic/Immunologic: Negative.   Neurological: Negative for tremors and numbness.  Hematological: Negative for adenopathy. Does not bruise/bleed easily.  Psychiatric/Behavioral: Negative for behavioral problems and sleep disturbance. The patient is not nervous/anxious.     Vital Signs: BP (!) 148/73   Pulse 78   Temp 97.6 F (36.4 C)   Resp 16   Wt 190 lb (86.2 kg)   SpO2 100%   BMI 38.38 kg/m    Physical Exam Vitals and nursing note reviewed.  Constitutional:      General: She is not in acute distress.    Appearance: She is well-developed. She is not diaphoretic.  HENT:     Head: Normocephalic and atraumatic.     Mouth/Throat:     Pharynx: No oropharyngeal exudate.  Eyes:     Pupils: Pupils are equal, round, and reactive to light.  Neck:     Thyroid: No thyromegaly.     Vascular: No JVD.     Trachea: No tracheal deviation.  Cardiovascular:     Rate and Rhythm: Normal rate and regular rhythm.     Heart sounds: Normal heart sounds. No murmur. No friction rub. No gallop.   Pulmonary:     Effort: Pulmonary effort is normal. No respiratory distress.     Breath sounds: Normal breath sounds. No wheezing or rales.  Chest:     Chest wall: No tenderness.  Abdominal:     Palpations: Abdomen is soft.     Tenderness: There is no abdominal tenderness. There is no guarding.  Musculoskeletal:        General: Normal range of motion.     Cervical  back: Normal range of motion and neck supple.  Lymphadenopathy:     Cervical: No cervical adenopathy.  Skin:    General: Skin is warm and dry.  Neurological:     Mental Status: She is alert and oriented to person, place, and time.     Cranial Nerves: No cranial nerve deficit.  Psychiatric:        Behavior: Behavior normal.        Thought Content: Thought content normal.        Judgment: Judgment normal.     Assessment/Plan: 1. Hepatic steatosis Found  on [place], otherwise negative study.  PT reports consuming an average of 2 alcoholic beverages daily. AST and ALT were normal at last blood draw. We discussed the impact of fatty foods in diet as well as weight loss.    2. Irritable bowel syndrome with both constipation and diarrhea Trial of Bentyl for gastric spasm, will follow up via telephone with results.  - dicyclomine (BENTYL) 10 MG capsule; Take 1 capsule (10 mg total) by mouth 2 (two) times daily as needed for spasms.  Dispense: 20 capsule; Refill: 0  3. Essential hypertension Controlled, continue current management.  4. Class 1 obesity due to excess calories without serious comorbidity with body mass index (BMI) of 33.0 to 33.9 in adult Obesity Counseling: Risk Assessment: An assessment of behavioral risk factors was made today and includes lack of exercise sedentary lifestyle, lack of portion control and poor dietary habits.  Risk Modification Advice: She was counseled on portion control guidelines. Restricting daily caloric intake to. . The detrimental long term effects of obesity on her health and ongoing poor compliance was also discussed with the patient.    General Counseling: reni hausner understanding of the findings of todays visit and agrees with plan of treatment. I have discussed any further diagnostic evaluation that may be needed or ordered today. We also reviewed her medications today. she has been encouraged to call the office with any questions or concerns  that should arise related to todays visit.    No orders of the defined types were placed in this encounter.   Meds ordered this encounter  Medications  . dicyclomine (BENTYL) 10 MG capsule    Sig: Take 1 capsule (10 mg total) by mouth 2 (two) times daily as needed for spasms.    Dispense:  20 capsule    Refill:  0    Time spent: 25 Minutes   This patient was seen by Orson Gear AGNP-C in Collaboration with Dr Lavera Guise as a part of collaborative care agreement     Kendell Bane AGNP-C Internal medicine

## 2019-01-24 NOTE — Progress Notes (Addendum)
Ultrasound looks good. Discuss with patient at visit 02/17/2019, Might need to see GI

## 2019-01-25 ENCOUNTER — Other Ambulatory Visit: Payer: Self-pay

## 2019-01-25 DIAGNOSIS — R030 Elevated blood-pressure reading, without diagnosis of hypertension: Secondary | ICD-10-CM

## 2019-01-25 MED ORDER — AMLODIPINE BESYLATE 5 MG PO TABS: 5.0000 mg | ORAL_TABLET | Freq: Every day | ORAL | 1 refills | Status: DC

## 2019-02-02 ENCOUNTER — Other Ambulatory Visit: Payer: No Typology Code available for payment source

## 2019-02-15 ENCOUNTER — Telehealth: Payer: Self-pay

## 2019-02-15 NOTE — Telephone Encounter (Signed)
CONFIRMED AND SCREENED FOR 02-17-19 OV. 

## 2019-02-17 ENCOUNTER — Encounter: Payer: Self-pay | Admitting: Nurse Practitioner

## 2019-02-17 ENCOUNTER — Ambulatory Visit: Payer: 59 | Admitting: Nurse Practitioner

## 2019-02-17 ENCOUNTER — Other Ambulatory Visit: Payer: Self-pay

## 2019-02-17 VITALS — BP 147/91 | HR 99 | Temp 97.9°F | Resp 16 | Ht 61.0 in | Wt 186.6 lb

## 2019-02-17 DIAGNOSIS — I10 Essential (primary) hypertension: Secondary | ICD-10-CM | POA: Diagnosis not present

## 2019-02-17 DIAGNOSIS — J0110 Acute frontal sinusitis, unspecified: Secondary | ICD-10-CM

## 2019-02-17 MED ORDER — AZITHROMYCIN 250 MG PO TABS: ORAL_TABLET | ORAL | 0 refills | Status: DC

## 2019-02-17 NOTE — Progress Notes (Signed)
[facility] [address] [place], [place] [address]  Internal MEDICINE  Office Visit Note  Patient Name: Lauren Marsh  [id number]  [id number]  Date of Service: 02/19/2019  Chief Complaint  Patient presents with  . Gastroesophageal Reflux  . Sinusitis  . Headache  . Cough    The patient presents for follow up visit. Today, she has nasal congestion, along with frontal sinus headache. She has congested cough. She denies fever, body aches, or chills.  She denies abdominal pain, nausea, or vomiting. She does not believe she has been exposed to anyone with COVID 19. She has been taking OTC sinus medication. This has helped congestion to drain some.       Current Medication: Outpatient Encounter Medications as of 02/17/2019  Medication Sig  . acetaminophen (TYLENOL) 500 MG tablet Take by mouth.  Marland Kitchen amLODipine (NORVASC) 5 MG tablet Take 1 tablet (5 mg total) by mouth daily.  Marland Kitchen dicyclomine (BENTYL) 10 MG capsule Take 1 capsule (10 mg total) by mouth 2 (two) times daily as needed for spasms.  . Eluxadoline (VIBERZI) 75 MG TABS Take 75 mg by mouth 2 (two) times daily as needed.  Marland Kitchen ibuprofen (ADVIL,MOTRIN) 600 MG tablet Take 1 tablet (600 mg total) by mouth every 8 (eight) hours as needed for headache.  . pantoprazole (PROTONIX) 40 MG tablet Take 1 tablet (40 mg total) by mouth daily. Take 1 tablet by mouth daily every morning  . TRI-LO-MARZIA 0.18/0.215/0.25 MG-25 MCG tab Use as directed for 28 days  . azithromycin (ZITHROMAX) 250 MG tablet z-pack - take as directed for 5 days for sinusitis   No facility-administered encounter medications on file as of 02/17/2019.    Surgical History: Past Surgical History:  Procedure Laterality Date  . EYE SURGERY      Medical History: Past Medical History:  Diagnosis Date  . GERD (gastroesophageal reflux disease)     Family History: Family History  Problem Relation Age of Onset  . Hypertension Mother   . Hypertension Father      Social History   Socioeconomic History  . Marital status: Married    Spouse name: Not on file  . Number of children: Not on file  . Years of education: Not on file  . Highest education level: Not on file  Occupational History  . Not on file  Tobacco Use  . Smoking status: Never Smoker  . Smokeless tobacco: Never Used  Substance and Sexual Activity  . Alcohol use: Yes    Comment: occasionally  . Drug use: Never  . Sexual activity: Not on file  Other Topics Concern  . Not on file  Social History Narrative  . Not on file   Social Determinants of Health   Financial Resource Strain:   . Difficulty of Paying Living Expenses: Not on file  Food Insecurity:   . Worried About Programme researcher, broadcasting/film/video in the Last Year: Not on file  . Ran Out of Food in the Last Year: Not on file  Transportation Needs:   . Lack of Transportation (Medical): Not on file  . Lack of Transportation (Non-Medical): Not on file  Physical Activity:   . Days of Exercise per Week: Not on file  . Minutes of Exercise per Session: Not on file  Stress:   . Feeling of Stress : Not on file  Social Connections:   . Frequency of Communication with Friends and Family: Not on file  . Frequency of Social Gatherings with Friends  and Family: Not on file  . Attends Religious Services: Not on file  . Active Member of Clubs or Organizations: Not on file  . Attends Banker Meetings: Not on file  . Marital Status: Not on file  Intimate Partner Violence:   . Fear of Current or Ex-Partner: Not on file  . Emotionally Abused: Not on file  . Physically Abused: Not on file  . Sexually Abused: Not on file      Review of Systems  Constitutional: Negative for chills, fatigue, fever and unexpected weight change.  HENT: Positive for congestion, postnasal drip, rhinorrhea, sinus pressure and sinus pain. Negative for sneezing, sore throat and voice change.   Respiratory: Positive for cough. Negative for chest  tightness, shortness of breath and wheezing.   Cardiovascular: Negative for chest pain and palpitations.  Gastrointestinal: Negative for abdominal pain, constipation, diarrhea, nausea and vomiting.  Endocrine: Negative for cold intolerance, heat intolerance, polydipsia and polyuria.  Musculoskeletal: Negative for arthralgias, back pain, joint swelling and neck pain.  Skin: Negative for rash.  Allergic/Immunologic: Positive for environmental allergies.  Neurological: Positive for headaches. Negative for dizziness, tremors and numbness.  Hematological: Positive for adenopathy. Does not bruise/bleed easily.  Psychiatric/Behavioral: Negative for behavioral problems (Depression), sleep disturbance and suicidal ideas. The patient is not nervous/anxious.     Today's Vitals   02/17/19 0957  BP: (!) 147/91  Pulse: 99  Resp: 16  Temp: 97.9 F (36.6 C)  SpO2: 98%  Weight: 186 lb 9.6 oz (84.6 kg)  Height: 5\' 1"  (1.549 m)   Body mass index is 35.26 kg/m.  Physical Exam Vitals and nursing note reviewed.  Constitutional:      General: She is not in acute distress.    Appearance: Normal appearance. She is well-developed. She is ill-appearing. She is not diaphoretic.  HENT:     Head: Normocephalic and atraumatic.     Nose: Congestion present.     Mouth/Throat:     Pharynx: No oropharyngeal exudate.  Eyes:     Extraocular Movements: Extraocular movements intact.     Pupils: Pupils are equal, round, and reactive to light.  Neck:     Thyroid: No thyromegaly.     Vascular: No JVD.     Trachea: No tracheal deviation.  Cardiovascular:     Rate and Rhythm: Normal rate and regular rhythm.     Heart sounds: Normal heart sounds. No murmur. No friction rub. No gallop.   Pulmonary:     Effort: Pulmonary effort is normal. No respiratory distress.     Breath sounds: Normal breath sounds. No wheezing or rales.     Comments: The patient does have mild congested, non-productive cough present.  Chest:      Chest wall: No tenderness.  Abdominal:     Palpations: Abdomen is soft.  Musculoskeletal:        General: Normal range of motion.     Cervical back: Normal range of motion and neck supple.  Lymphadenopathy:     Cervical: Cervical adenopathy present.  Skin:    General: Skin is warm and dry.  Neurological:     Mental Status: She is alert and oriented to person, place, and time.     Cranial Nerves: No cranial nerve deficit.  Psychiatric:        Behavior: Behavior normal.        Thought Content: Thought content normal.        Judgment: Judgment normal.    Assessment/Plan: 1. Acute  non-recurrent frontal sinusitis Start z-pack. Take as directed for 5 days. Rest and increase fluids. Use OTC medication as needed and as indicated to treat symptoms.  - azithromycin (ZITHROMAX) 250 MG tablet; z-pack - take as directed for 5 days for sinusitis  Dispense: 6 tablet; Refill: 0  2. Essential hypertension Generally stable. Continue BP medication as prescribed   General Counseling: amarea macdowell understanding of the findings of todays visit and agrees with plan of treatment. I have discussed any further diagnostic evaluation that may be needed or ordered today. We also reviewed her medications today. she has been encouraged to call the office with any questions or concerns that should arise related to todays visit.   Hypertension Counseling:   The following hypertensive lifestyle modification were recommended and discussed:  1. Limiting alcohol intake to less than 1 oz/day of ethanol:(24 oz of beer or 8 oz of wine or 2 oz of 100-proof whiskey). 2. Take baby ASA 81 mg daily. 3. Importance of regular aerobic exercise and losing weight. 4. Reduce dietary saturated fat and cholesterol intake for overall cardiovascular health. 5. Maintaining adequate dietary potassium, calcium, and magnesium intake. 6. Regular monitoring of the blood pressure. 7. Reduce sodium intake to less than 100  mmol/day (less than 2.3 gm of sodium or less than 6 gm of sodium choride)   This patient was seen by [facility] with Dr Lavera Guise as a part of collaborative care agreement  Meds ordered this encounter  Medications  . azithromycin (ZITHROMAX) 250 MG tablet    Sig: z-pack - take as directed for 5 days for sinusitis    Dispense:  6 tablet    Refill:  0    Order Specific Question:   Supervising Provider    Answer:   Lavera Guise [[id number]]    Total Time spent: 74 Minutes      Dr Lavera Guise Internal medicine

## 2019-04-01 ENCOUNTER — Other Ambulatory Visit: Payer: Self-pay

## 2019-04-01 DIAGNOSIS — Z3041 Encounter for surveillance of contraceptive pills: Secondary | ICD-10-CM

## 2019-04-01 MED ORDER — TRI-LO-MARZIA 0.18/0.215/0.25 MG-25 MCG PO TABS: ORAL_TABLET | ORAL | 12 refills | Status: DC

## 2019-05-05 ENCOUNTER — Other Ambulatory Visit: Payer: Self-pay

## 2019-05-05 MED ORDER — PANTOPRAZOLE SODIUM 40 MG PO TBEC: 40.0000 mg | DELAYED_RELEASE_TABLET | Freq: Every day | ORAL | 1 refills | Status: DC

## 2019-06-14 ENCOUNTER — Telehealth: Payer: Self-pay

## 2019-06-14 NOTE — Telephone Encounter (Signed)
Confirmed appointment on 06/16/2019 and screened for covid. [facility] 

## 2019-06-16 ENCOUNTER — Ambulatory Visit: Payer: 59 | Admitting: Adult Health

## 2019-06-17 ENCOUNTER — Ambulatory Visit: Payer: 59 | Admitting: Adult Health

## 2019-06-22 ENCOUNTER — Other Ambulatory Visit: Payer: Self-pay

## 2019-06-22 DIAGNOSIS — Z3041 Encounter for surveillance of contraceptive pills: Secondary | ICD-10-CM

## 2019-06-22 MED ORDER — TRI-LO-MARZIA 0.18/0.215/0.25 MG-25 MCG PO TABS: ORAL_TABLET | ORAL | 1 refills | Status: DC

## 2019-07-25 ENCOUNTER — Other Ambulatory Visit: Payer: Self-pay

## 2019-07-25 DIAGNOSIS — R030 Elevated blood-pressure reading, without diagnosis of hypertension: Secondary | ICD-10-CM

## 2019-07-25 MED ORDER — AMLODIPINE BESYLATE 5 MG PO TABS: 5.0000 mg | ORAL_TABLET | Freq: Every day | ORAL | 1 refills | Status: DC

## 2019-08-26 DIAGNOSIS — M7751 Other enthesopathy of right foot: Secondary | ICD-10-CM | POA: Diagnosis not present

## 2019-08-26 DIAGNOSIS — M25571 Pain in right ankle and joints of right foot: Secondary | ICD-10-CM | POA: Diagnosis not present

## 2019-12-05 ENCOUNTER — Other Ambulatory Visit: Payer: Self-pay

## 2019-12-05 DIAGNOSIS — Z3041 Encounter for surveillance of contraceptive pills: Secondary | ICD-10-CM

## 2019-12-05 MED ORDER — TRI-LO-MARZIA 0.18/0.215/0.25 MG-25 MCG PO TABS: ORAL_TABLET | ORAL | 11 refills | Status: DC

## 2020-01-19 ENCOUNTER — Other Ambulatory Visit: Payer: Self-pay

## 2020-01-19 MED ORDER — PANTOPRAZOLE SODIUM 40 MG PO TBEC
40.0000 mg | DELAYED_RELEASE_TABLET | Freq: Every day | ORAL | 0 refills | Status: DC
Start: 2020-01-19 — End: 2020-02-13

## 2020-01-23 ENCOUNTER — Other Ambulatory Visit: Payer: Self-pay

## 2020-01-23 DIAGNOSIS — R030 Elevated blood-pressure reading, without diagnosis of hypertension: Secondary | ICD-10-CM

## 2020-01-23 MED ORDER — AMLODIPINE BESYLATE 5 MG PO TABS: 5.0000 mg | ORAL_TABLET | Freq: Every day | ORAL | 0 refills | Status: DC

## 2020-02-06 DIAGNOSIS — U071 COVID-19: Secondary | ICD-10-CM | POA: Diagnosis not present

## 2020-02-13 ENCOUNTER — Encounter: Payer: Self-pay | Admitting: Hospice and Palliative Medicine

## 2020-02-13 ENCOUNTER — Other Ambulatory Visit: Payer: Self-pay

## 2020-02-13 MED ORDER — PANTOPRAZOLE SODIUM 40 MG PO TBEC
40.0000 mg | DELAYED_RELEASE_TABLET | Freq: Every day | ORAL | 0 refills | Status: DC
Start: 2020-02-13 — End: 2020-03-12

## 2020-03-12 ENCOUNTER — Other Ambulatory Visit: Payer: Self-pay

## 2020-03-12 MED ORDER — PANTOPRAZOLE SODIUM 40 MG PO TBEC
40.0000 mg | DELAYED_RELEASE_TABLET | Freq: Every day | ORAL | 0 refills | Status: DC
Start: 2020-03-12 — End: 2020-03-16

## 2020-03-16 ENCOUNTER — Ambulatory Visit: Payer: BC Managed Care – PPO | Admitting: Hospice and Palliative Medicine

## 2020-03-16 ENCOUNTER — Encounter: Payer: Self-pay | Admitting: Hospice and Palliative Medicine

## 2020-03-16 VITALS — BP 138/84 | HR 95 | Temp 97.6°F | Resp 16 | Wt 196.4 lb

## 2020-03-16 DIAGNOSIS — R030 Elevated blood-pressure reading, without diagnosis of hypertension: Secondary | ICD-10-CM

## 2020-03-16 DIAGNOSIS — Z0001 Encounter for general adult medical examination with abnormal findings: Secondary | ICD-10-CM | POA: Diagnosis not present

## 2020-03-16 DIAGNOSIS — R300 Dysuria: Secondary | ICD-10-CM

## 2020-03-16 DIAGNOSIS — R5383 Other fatigue: Secondary | ICD-10-CM

## 2020-03-16 DIAGNOSIS — Z3041 Encounter for surveillance of contraceptive pills: Secondary | ICD-10-CM

## 2020-03-16 DIAGNOSIS — Z1231 Encounter for screening mammogram for malignant neoplasm of breast: Secondary | ICD-10-CM

## 2020-03-16 DIAGNOSIS — K219 Gastro-esophageal reflux disease without esophagitis: Secondary | ICD-10-CM

## 2020-03-16 DIAGNOSIS — Z124 Encounter for screening for malignant neoplasm of cervix: Secondary | ICD-10-CM

## 2020-03-16 DIAGNOSIS — R140 Abdominal distension (gaseous): Secondary | ICD-10-CM

## 2020-03-16 DIAGNOSIS — I10 Essential (primary) hypertension: Secondary | ICD-10-CM | POA: Diagnosis not present

## 2020-03-16 MED ORDER — AMLODIPINE BESYLATE 5 MG PO TABS: 5.0000 mg | ORAL_TABLET | Freq: Every day | ORAL | 0 refills | Status: DC

## 2020-03-16 MED ORDER — PANTOPRAZOLE SODIUM 40 MG PO TBEC: 40.0000 mg | DELAYED_RELEASE_TABLET | Freq: Every day | ORAL | 1 refills | Status: DC

## 2020-03-16 MED ORDER — TRI-LO-MARZIA 0.18/0.215/0.25 MG-25 MCG PO TABS: ORAL_TABLET | ORAL | 11 refills | Status: DC

## 2020-03-16 NOTE — Progress Notes (Signed)
[facility] [address] [place], [place] [address]  Internal MEDICINE  Office Visit Note  Patient Name: Lauren Marsh  [id number]  [id number]  Date of Service: 03/19/2020  Chief Complaint  Patient presents with  . Annual Exam  . Gastroesophageal Reflux  . Abdominal Pain    Burning sensation , gassy     HPI Pt is here for routine health maintenance examination Overall, things have been going well Has not yet had routine fasting labs C/o epigastric burning and excessive gas--has a history of IBS-D Pain started about 2-3 weeks ago--not associated with n/v Had colonoscopy in 2016--normal Due for mammogram screening  Continues to take oral BC--still having a regular menstrual cycle each month  Current Medication: Outpatient Encounter Medications as of 03/16/2020  Medication Sig  . acetaminophen (TYLENOL) 500 MG tablet Take by mouth.  Marland Kitchen amLODipine (NORVASC) 5 MG tablet Take 1 tablet (5 mg total) by mouth daily.  Marland Kitchen ibuprofen (ADVIL,MOTRIN) 600 MG tablet Take 1 tablet (600 mg total) by mouth every 8 (eight) hours as needed for headache.  . pantoprazole (PROTONIX) 40 MG tablet Take 1 tablet (40 mg total) by mouth daily. Take 1 tablet by mouth daily every morning  . TRI-LO-MARZIA 0.18/0.215/0.25 MG-25 MCG tab Use as directed for 28 days  . [DISCONTINUED] amLODipine (NORVASC) 5 MG tablet Take 1 tablet (5 mg total) by mouth daily.  . [DISCONTINUED] azithromycin (ZITHROMAX) 250 MG tablet z-pack - take as directed for 5 days for sinusitis  . [DISCONTINUED] dicyclomine (BENTYL) 10 MG capsule Take 1 capsule (10 mg total) by mouth 2 (two) times daily as needed for spasms.  . [DISCONTINUED] Eluxadoline (VIBERZI) 75 MG TABS Take 75 mg by mouth 2 (two) times daily as needed.  . [DISCONTINUED] pantoprazole (PROTONIX) 40 MG tablet Take 1 tablet (40 mg total) by mouth daily. Take 1 tablet by mouth daily every morning  . [DISCONTINUED] TRI-LO-MARZIA 0.18/0.215/0.25 MG-25 MCG tab  Use as directed for 28 days   No facility-administered encounter medications on file as of 03/16/2020.    Surgical History: Past Surgical History:  Procedure Laterality Date  . EYE SURGERY      Medical History: Past Medical History:  Diagnosis Date  . GERD (gastroesophageal reflux disease)     Family History: Family History  Problem Relation Age of Onset  . Hypertension Mother   . Hypertension Father       Review of Systems  Constitutional: Negative for chills, diaphoresis and fatigue.  HENT: Negative for ear pain, postnasal drip and sinus pressure.   Eyes: Negative for photophobia, discharge, redness, itching and visual disturbance.  Respiratory: Negative for cough, shortness of breath and wheezing.   Cardiovascular: Negative for chest pain, palpitations and leg swelling.  Gastrointestinal: Positive for abdominal distention, abdominal pain and diarrhea. Negative for constipation, nausea and vomiting.  Genitourinary: Negative for dysuria and flank pain.  Musculoskeletal: Negative for arthralgias, back pain, gait problem and neck pain.  Skin: Negative for color change.  Allergic/Immunologic: Negative for environmental allergies and food allergies.  Neurological: Negative for dizziness and headaches.  Hematological: Does not bruise/bleed easily.  Psychiatric/Behavioral: Negative for agitation, behavioral problems (depression) and hallucinations.     Vital Signs: BP 138/84   Pulse 95   Temp 97.6 F (36.4 C)   Resp 16   Wt 196 lb 6.4 oz (89.1 kg)   SpO2 97%   BMI 37.11 kg/m    Physical Exam Vitals reviewed.  Constitutional:      Appearance:  Normal appearance. She is well-developed and normal weight.  Cardiovascular:     Rate and Rhythm: Normal rate and regular rhythm.     Pulses: Normal pulses.     Heart sounds: Normal heart sounds.  Pulmonary:     Effort: Pulmonary effort is normal.     Breath sounds: Normal breath sounds.  Chest:  Breasts:     Right:  Normal.     Left: Normal.    Abdominal:     General: There is distension.     Palpations: Abdomen is soft.  Musculoskeletal:        General: Normal range of motion.     Cervical back: Normal range of motion.  Skin:    General: Skin is warm.  Neurological:     General: No focal deficit present.     Mental Status: She is alert and oriented to person, place, and time. Mental status is at baseline.  Psychiatric:        Mood and Affect: Mood normal.        Behavior: Behavior normal.        Thought Content: Thought content normal.        Judgment: Judgment normal.    LABS: No results found for this or any previous visit (from the past 2160 hour(s)).   Assessment/Plan: 1. Encounter for routine adult health examination with abnormal findings Well appearing 55 year old female Will review routine labs and adjust therapy as indicated Mammogram ordered to update PHM - CBC w/Diff/Platelet - Comprehensive Metabolic Panel (CMET) - Lipid Panel With LDL/HDL Ratio - TSH + free T4  2. Essential hypertension BP and HR well controlled today continue current therapy, requesting refills - Comprehensive Metabolic Panel (CMET) - amLODipine (NORVASC) 5 MG tablet; Take 1 tablet (5 mg total) by mouth daily.  Dispense: 30 tablet; Refill: 0  3. Abdominal distension (gaseous) Review CT for distention and pain//ongoing menstruation with increased age Consider CA125 testing - CT Abdomen Pelvis Wo Contrast; Future  4. Oral contraceptive use Will need to look into ordering hormone levels//regular menstrual cycles - TRI-LO-MARZIA 0.18/0.215/0.25 MG-25 MCG tab; Use as directed for 28 days  Dispense: 28 tablet; Refill: 11  5. Encounter for screening mammogram for malignant neoplasm of breast - MM Digital Screening; Future  6. Gastroesophageal reflux disease without esophagitis Pantoprazole for ongoing symptoms of GERD Consider H. Pylori testing//endoscopy - pantoprazole (PROTONIX) 40 MG tablet; Take  1 tablet (40 mg total) by mouth daily. Take 1 tablet by mouth daily every morning  Dispense: 90 tablet; Refill: 1  7. Other fatigue - CBC w/Diff/Platelet - Comprehensive Metabolic Panel (CMET) - Lipid Panel With LDL/HDL Ratio - TSH + free T4  General Counseling: Lauren Marsh verbalizes understanding of the findings of todays visit and agrees with plan of treatment. I have discussed any further diagnostic evaluation that may be needed or ordered today. We also reviewed her medications today. she has been encouraged to call the office with any questions or concerns that should arise related to todays visit.    Counseling:    Orders Placed This Encounter  Procedures  . MM Digital Screening  . CT Abdomen Pelvis Wo Contrast  . CBC w/Diff/Platelet  . Comprehensive Metabolic Panel (CMET)  . Lipid Panel With LDL/HDL Ratio  . TSH + free T4    Meds ordered this encounter  Medications  . amLODipine (NORVASC) 5 MG tablet    Sig: Take 1 tablet (5 mg total) by mouth daily.    Dispense:  30 tablet    Refill:  0    Pt need appt for refills for 90 days  . TRI-LO-MARZIA 0.18/0.215/0.25 MG-25 MCG tab    Sig: Use as directed for 28 days    Dispense:  28 tablet    Refill:  11  . pantoprazole (PROTONIX) 40 MG tablet    Sig: Take 1 tablet (40 mg total) by mouth daily. Take 1 tablet by mouth daily every morning    Dispense:  90 tablet    Refill:  1    Total time spent: 30 Minutes  Time spent includes review of chart, medications, test results, and follow up plan with the patient.   This patient was seen by Leeanne Deed AGNP-C Collaboration with Dr Lyndon Code as a part of collaborative care agreement   Lubertha Basque. [facility] Internal Medicine

## 2020-03-19 ENCOUNTER — Encounter: Payer: Self-pay | Admitting: Hospice and Palliative Medicine

## 2020-03-20 DIAGNOSIS — Z0001 Encounter for general adult medical examination with abnormal findings: Secondary | ICD-10-CM | POA: Diagnosis not present

## 2020-03-20 DIAGNOSIS — R300 Dysuria: Secondary | ICD-10-CM | POA: Diagnosis not present

## 2020-03-21 LAB — COMPREHENSIVE METABOLIC PANEL
ALT: 21 IU/L (ref 0–32)
AST: 20 IU/L (ref 0–40)
Albumin/Globulin Ratio: 1.6 (ref 1.2–2.2)
Albumin: 4 g/dL (ref 3.8–4.9)
Alkaline Phosphatase: 50 IU/L (ref 44–121)
BUN/Creatinine Ratio: 10 (ref 9–23)
BUN: 8 mg/dL (ref 6–24)
Bilirubin Total: 0.4 mg/dL (ref 0.0–1.2)
CO2: 19 mmol/L — ABNORMAL LOW (ref 20–29)
Calcium: 9.2 mg/dL (ref 8.7–10.2)
Chloride: 103 mmol/L (ref 96–106)
Creatinine, Ser: 0.79 mg/dL (ref 0.57–1.00)
GFR calc Af Amer: 97 mL/min/{1.73_m2} (ref >59)
GFR calc non Af Amer: 84 mL/min/{1.73_m2} (ref >59)
Globulin, Total: 2.5 g/dL (ref 1.5–4.5)
Glucose: 95 mg/dL (ref 65–99)
Potassium: 4.7 mmol/L (ref 3.5–5.2)
Sodium: 138 mmol/L (ref 134–144)
Total Protein: 6.5 g/dL (ref 6.0–8.5)

## 2020-03-21 LAB — CBC WITH DIFFERENTIAL/PLATELET
Basophils Absolute: 0.1 10*3/uL (ref 0.0–0.2)
Basos: 1 %
EOS (ABSOLUTE): 0.5 10*3/uL — ABNORMAL HIGH (ref 0.0–0.4)
Eos: 7 %
Hematocrit: 39.6 % (ref 34.0–46.6)
Hemoglobin: 13.6 g/dL (ref 11.1–15.9)
Immature Grans (Abs): 0 10*3/uL (ref 0.0–0.1)
Immature Granulocytes: 0 %
Lymphocytes Absolute: 1.6 10*3/uL (ref 0.7–3.1)
Lymphs: 24 %
MCH: 30.9 pg (ref 26.6–33.0)
MCHC: 34.3 g/dL (ref 31.5–35.7)
MCV: 90 fL (ref 79–97)
Monocytes Absolute: 0.6 10*3/uL (ref 0.1–0.9)
Monocytes: 9 %
Neutrophils Absolute: 3.9 10*3/uL (ref 1.4–7.0)
Neutrophils: 59 %
Platelets: 312 10*3/uL (ref 150–450)
RBC: 4.4 x10E6/uL (ref 3.77–5.28)
RDW: 12.8 % (ref 11.7–15.4)
WBC: 6.7 10*3/uL (ref 3.4–10.8)

## 2020-03-21 LAB — LIPID PANEL WITH LDL/HDL RATIO
Cholesterol, Total: 218 mg/dL — ABNORMAL HIGH (ref 100–199)
HDL: 65 mg/dL (ref >39)
LDL Chol Calc (NIH): 130 mg/dL — ABNORMAL HIGH (ref 0–99)
LDL/HDL Ratio: 2 ratio (ref 0.0–3.2)
Triglycerides: 129 mg/dL (ref 0–149)
VLDL Cholesterol Cal: 23 mg/dL (ref 5–40)

## 2020-03-21 LAB — TSH+FREE T4
Free T4: 1.05 ng/dL (ref 0.82–1.77)
TSH: 2.57 u[IU]/mL (ref 0.450–4.500)

## 2020-03-22 NOTE — Progress Notes (Signed)
Labs reviewed and will be discussed at next visit.

## 2020-03-23 ENCOUNTER — Other Ambulatory Visit: Payer: Self-pay | Admitting: Hospice and Palliative Medicine

## 2020-03-26 ENCOUNTER — Other Ambulatory Visit: Payer: Self-pay

## 2020-03-26 ENCOUNTER — Telehealth: Payer: Self-pay

## 2020-03-26 DIAGNOSIS — N926 Irregular menstruation, unspecified: Secondary | ICD-10-CM

## 2020-03-26 DIAGNOSIS — R140 Abdominal distension (gaseous): Secondary | ICD-10-CM

## 2020-03-26 NOTE — Telephone Encounter (Signed)
Spoke with patient and advised her CT was denied and we ordered and scheduled a ultrasound. Lauren Marsh

## 2020-04-04 ENCOUNTER — Other Ambulatory Visit: Payer: Self-pay

## 2020-04-04 ENCOUNTER — Ambulatory Visit
Admission: RE | Admit: 2020-04-04 | Discharge: 2020-04-04 | Disposition: A | Discharge status: Home / Self Care | Payer: BC Managed Care – PPO | Source: Ambulatory Visit | Attending: Hospice and Palliative Medicine | Admitting: Hospice and Palliative Medicine

## 2020-04-04 DIAGNOSIS — N926 Irregular menstruation, unspecified: Secondary | ICD-10-CM | POA: Insufficient documentation

## 2020-04-04 DIAGNOSIS — N1330 Unspecified hydronephrosis: Secondary | ICD-10-CM | POA: Diagnosis not present

## 2020-04-04 DIAGNOSIS — R140 Abdominal distension (gaseous): Secondary | ICD-10-CM | POA: Insufficient documentation

## 2020-04-05 NOTE — Progress Notes (Signed)
Please review and I would like to discuss this patient with you.

## 2020-04-16 DIAGNOSIS — Z1231 Encounter for screening mammogram for malignant neoplasm of breast: Secondary | ICD-10-CM | POA: Diagnosis not present

## 2020-04-26 ENCOUNTER — Encounter: Payer: Self-pay | Admitting: Hospice and Palliative Medicine

## 2020-04-26 ENCOUNTER — Other Ambulatory Visit: Payer: Self-pay

## 2020-04-26 ENCOUNTER — Ambulatory Visit: Payer: BC Managed Care – PPO | Admitting: Hospice and Palliative Medicine

## 2020-04-26 VITALS — BP 138/76 | HR 88 | Temp 97.9°F | Resp 16 | Ht 59.0 in | Wt 195.0 lb

## 2020-04-26 DIAGNOSIS — K5900 Constipation, unspecified: Secondary | ICD-10-CM | POA: Diagnosis not present

## 2020-04-26 DIAGNOSIS — Z3041 Encounter for surveillance of contraceptive pills: Secondary | ICD-10-CM | POA: Diagnosis not present

## 2020-04-26 DIAGNOSIS — N1330 Unspecified hydronephrosis: Secondary | ICD-10-CM | POA: Diagnosis not present

## 2020-04-26 NOTE — Progress Notes (Signed)
[facility] [address] [place], [place] [address]  Internal MEDICINE  Office Visit Note  Patient Name: Lauren Marsh  [id number]  [id number]  Date of Service: 04/27/2020  Chief Complaint  Patient presents with  . Hypertension  . Gastroesophageal Reflux  . Follow-up    Labs     HPI Patient is here for routine follow-up Reviewed imaging since last visit due to complaints of epigastric pain and excessive abdominal bloating and flatulence Abdominal US--mild right kidney hydronephrosis Pelvic and transvaginal US--limited exam due to extensive bowel gas CT scan denies by insurance  Pain is still present--somewhat improved, she has significantly cut back on her alcohol intake since our last visit Having issues with constipation--will go 5-6 days without having a bowel movement, stools are hard Does not feel constipated, even though stool are hard she denies straining Has had issues with bowel movements for many years--these are not acute changes  Last colonoscopy 2016--normal   Current Medication: Outpatient Encounter Medications as of 04/26/2020  Medication Sig  . acetaminophen (TYLENOL) 500 MG tablet Take by mouth.  Marland Kitchen amLODipine (NORVASC) 5 MG tablet Take 1 tablet (5 mg total) by mouth daily.  . pantoprazole (PROTONIX) 40 MG tablet Take 1 tablet (40 mg total) by mouth daily. Take 1 tablet by mouth daily every morning  . TRI-LO-MARZIA 0.18/0.215/0.25 MG-25 MCG tab Use as directed for 28 days  . [DISCONTINUED] ibuprofen (ADVIL,MOTRIN) 600 MG tablet Take 1 tablet (600 mg total) by mouth every 8 (eight) hours as needed for headache.   No facility-administered encounter medications on file as of 04/26/2020.    Surgical History: Past Surgical History:  Procedure Laterality Date  . EYE SURGERY      Medical History: Past Medical History:  Diagnosis Date  . GERD (gastroesophageal reflux disease)   . Hypertension     Family History: Family History   Problem Relation Age of Onset  . Hypertension Mother   . Hypertension Father     Social History   Socioeconomic History  . Marital status: Married    Spouse name: Not on file  . Number of children: Not on file  . Years of education: Not on file  . Highest education level: Not on file  Occupational History  . Not on file  Tobacco Use  . Smoking status: Never Smoker  . Smokeless tobacco: Never Used  Vaping Use  . Vaping Use: Never used  Substance and Sexual Activity  . Alcohol use: Yes    Comment: occasionally  . Drug use: Never  . Sexual activity: Not on file  Other Topics Concern  . Not on file  Social History Narrative  . Not on file   Social Determinants of Health   Financial Resource Strain: Not on file  Food Insecurity: Not on file  Transportation Needs: Not on file  Physical Activity: Not on file  Stress: Not on file  Social Connections: Not on file  Intimate Partner Violence: Not on file      Review of Systems  Constitutional: Negative for chills, diaphoresis and fatigue.  HENT: Negative for ear pain, postnasal drip and sinus pressure.   Eyes: Negative for photophobia, discharge, redness, itching and visual disturbance.  Respiratory: Negative for cough, shortness of breath and wheezing.   Cardiovascular: Negative for chest pain, palpitations and leg swelling.  Gastrointestinal: Positive for abdominal distention and constipation. Negative for abdominal pain, diarrhea, nausea and vomiting.       Excessive gas  Genitourinary: Negative for  dysuria and flank pain.  Musculoskeletal: Negative for arthralgias, back pain, gait problem and neck pain.  Skin: Negative for color change.  Allergic/Immunologic: Negative for environmental allergies and food allergies.  Neurological: Negative for dizziness and headaches.  Hematological: Does not bruise/bleed easily.  Psychiatric/Behavioral: Negative for agitation, behavioral problems (depression) and hallucinations.     Vital Signs: BP 138/76   Pulse 88   Temp 97.9 F (36.6 C)   Resp 16   Ht 4\' 11"  (1.499 m)   Wt 195 lb (88.5 kg)   SpO2 97%   BMI 39.39 kg/m    Physical Exam Vitals reviewed.  Constitutional:      Appearance: Normal appearance. She is normal weight.  Cardiovascular:     Rate and Rhythm: Normal rate and regular rhythm.     Pulses: Normal pulses.     Heart sounds: Normal heart sounds.  Pulmonary:     Effort: Pulmonary effort is normal.     Breath sounds: Normal breath sounds.  Abdominal:     General: Abdomen is flat.     Palpations: Abdomen is soft.  Musculoskeletal:        General: Normal range of motion.     Cervical back: Normal range of motion.  Skin:    General: Skin is warm.  Neurological:     General: No focal deficit present.     Mental Status: She is alert and oriented to person, place, and time. Mental status is at baseline.  Psychiatric:        Mood and Affect: Mood normal.        Behavior: Behavior normal.        Thought Content: Thought content normal.        Judgment: Judgment normal.   Assessment/Plan: 1. Hydronephrosis, unspecified hydronephrosis type Hydronephrosis found on recent , will investigate further with renal US, kidney functions remains stable - US RENAL; Future  2. Constipation, unspecified constipation type Start daily stool softener Samples of Linzess given in office today to take as needed Will keep a log of bowel movements to discuss at next visit At this time she declines referral to GI for possible colonoscopy--discussed we will review her symptoms at next visit but will likely need referral if symptoms persist  3. Oral contraceptive use Will discuss reviewing hormone levels and possibly discontinuing birth control due to increased age  General Counseling: levonne carreras understanding of the findings of todays visit and agrees with plan of treatment. I have discussed any further diagnostic evaluation that may be needed  or ordered today. We also reviewed her medications today. she has been encouraged to call the office with any questions or concerns that should arise related to todays visit.    Orders Placed This Encounter  Procedures  . [name] RENAL   Time spent: 30 Minutes Time spent includes review of chart, medications, test results and follow-up plan with the patient.  This patient was seen by [place] AGNP-C in Collaboration with Dr Leeanne Deed as a part of collaborative care agreement     Lyndon Code. Kiptyn Rafuse AGNP-C Internal medicine

## 2020-04-27 ENCOUNTER — Encounter: Payer: Self-pay | Admitting: Hospice and Palliative Medicine

## 2020-05-09 ENCOUNTER — Other Ambulatory Visit: Payer: Self-pay | Admitting: Hospice and Palliative Medicine

## 2020-05-09 DIAGNOSIS — I10 Essential (primary) hypertension: Secondary | ICD-10-CM

## 2020-05-16 ENCOUNTER — Other Ambulatory Visit: Payer: BC Managed Care – PPO

## 2020-05-30 ENCOUNTER — Ambulatory Visit: Payer: BC Managed Care – PPO

## 2020-05-30 DIAGNOSIS — N1330 Unspecified hydronephrosis: Secondary | ICD-10-CM | POA: Diagnosis not present

## 2020-06-02 ENCOUNTER — Other Ambulatory Visit: Payer: Self-pay | Admitting: Hospice and Palliative Medicine

## 2020-06-02 DIAGNOSIS — I10 Essential (primary) hypertension: Secondary | ICD-10-CM

## 2020-06-08 ENCOUNTER — Encounter: Payer: Self-pay | Admitting: Hospice and Palliative Medicine

## 2020-06-08 ENCOUNTER — Other Ambulatory Visit: Payer: Self-pay

## 2020-06-08 ENCOUNTER — Ambulatory Visit: Payer: BC Managed Care – PPO | Admitting: Hospice and Palliative Medicine

## 2020-06-08 VITALS — BP 138/90 | HR 103 | Temp 97.8°F | Resp 16 | Ht 59.0 in | Wt 193.2 lb

## 2020-06-08 DIAGNOSIS — E782 Mixed hyperlipidemia: Secondary | ICD-10-CM

## 2020-06-08 DIAGNOSIS — N1330 Unspecified hydronephrosis: Secondary | ICD-10-CM | POA: Diagnosis not present

## 2020-06-08 DIAGNOSIS — I10 Essential (primary) hypertension: Secondary | ICD-10-CM | POA: Diagnosis not present

## 2020-06-08 DIAGNOSIS — K5900 Constipation, unspecified: Secondary | ICD-10-CM

## 2020-06-08 NOTE — Progress Notes (Signed)
[facility] [address] [place], [place] [address]  Internal MEDICINE  Office Visit Note  Patient Name: Lauren Marsh  [id number]  [id number]  Date of Service: 06/13/2020  Chief Complaint  Patient presents with  . Follow-up    Review [place] results  . Gastroesophageal Reflux  . Hypertension    HPI Patient is here for routine follow-up Reviewed renal US: mild to minimal bilateral hydronephrosis Denies urinary symptoms at this time Continues to complain of constipation and abdominal bloating--has been over 1 week without having a BM--this is fairly typical for her Samples of Linzess given at last visit has been helpful at reliving her constipation  Still having menstrual periods--have noticed that cycles have started slowing down  Current Medication: Outpatient Encounter Medications as of 06/08/2020  Medication Sig  . acetaminophen (TYLENOL) 500 MG tablet Take by mouth.  Marland Kitchen amLODipine (NORVASC) 5 MG tablet TAKE 1 TABLET (5 MG TOTAL) BY MOUTH DAILY.  . pantoprazole (PROTONIX) 40 MG tablet Take 1 tablet (40 mg total) by mouth daily. Take 1 tablet by mouth daily every morning  . TRI-LO-MARZIA 0.18/0.215/0.25 MG-25 MCG tab Use as directed for 28 days   No facility-administered encounter medications on file as of 06/08/2020.    Surgical History: Past Surgical History:  Procedure Laterality Date  . EYE SURGERY      Medical History: Past Medical History:  Diagnosis Date  . GERD (gastroesophageal reflux disease)   . Hypertension     Family History: Family History  Problem Relation Age of Onset  . Hypertension Mother   . Hypertension Father     Social History   Socioeconomic History  . Marital status: Married    Spouse name: Not on file  . Number of children: Not on file  . Years of education: Not on file  . Highest education level: Not on file  Occupational History  . Not on file  Tobacco Use  . Smoking status: Never Smoker  . Smokeless tobacco:  Never Used  Vaping Use  . Vaping Use: Never used  Substance and Sexual Activity  . Alcohol use: Yes    Comment: occasionally  . Drug use: Never  . Sexual activity: Not on file  Other Topics Concern  . Not on file  Social History Narrative  . Not on file   Social Determinants of Health   Financial Resource Strain: Not on file  Food Insecurity: Not on file  Transportation Needs: Not on file  Physical Activity: Not on file  Stress: Not on file  Social Connections: Not on file  Intimate Partner Violence: Not on file      Review of Systems  Constitutional: Negative for chills, diaphoresis and fatigue.  HENT: Negative for ear pain, postnasal drip and sinus pressure.   Eyes: Negative for photophobia, discharge, redness, itching and visual disturbance.  Respiratory: Negative for cough, shortness of breath and wheezing.   Cardiovascular: Negative for chest pain, palpitations and leg swelling.  Gastrointestinal: Positive for abdominal distention and constipation. Negative for abdominal pain, diarrhea, nausea and vomiting.  Genitourinary: Negative for dysuria and flank pain.  Musculoskeletal: Negative for arthralgias, back pain, gait problem and neck pain.  Skin: Negative for color change.  Allergic/Immunologic: Negative for environmental allergies and food allergies.  Neurological: Negative for dizziness and headaches.  Hematological: Does not bruise/bleed easily.  Psychiatric/Behavioral: Negative for agitation, behavioral problems (depression) and hallucinations.    Vital Signs: BP 138/90   Pulse (!) 103   Temp 97.8 F (  36.6 C)   Resp 16   Ht 4\' 11"  (1.499 m)   Wt 193 lb 3.2 oz (87.6 kg)   SpO2 99%   BMI 39.02 kg/m    Physical Exam Vitals reviewed.  Constitutional:      Appearance: Normal appearance. She is obese.  Cardiovascular:     Rate and Rhythm: Normal rate and regular rhythm.     Pulses: Normal pulses.     Heart sounds: Normal heart sounds.  Pulmonary:      Effort: Pulmonary effort is normal.     Breath sounds: Normal breath sounds.  Abdominal:     General: Abdomen is flat.     Palpations: Abdomen is soft.  Musculoskeletal:        General: Normal range of motion.     Cervical back: Normal range of motion.  Skin:    General: Skin is warm.  Neurological:     General: No focal deficit present.     Mental Status: She is alert and oriented to person, place, and time. Mental status is at baseline.  Psychiatric:        Mood and Affect: Mood normal.        Behavior: Behavior normal.        Thought Content: Thought content normal.        Judgment: Judgment normal.     Assessment/Plan: 1. Constipation, unspecified constipation type CT denied by insurance Further samples of Linzess given today Referral placed to GI for further evaluation of chronic constipation - Ambulatory referral to Gastroenterology  2. Hydronephrosis, unspecified hydronephrosis type Continue to monitor, may need urology referral  3. Essential hypertension BP remains well controlled on present management, continue to monitor  4. Mixed hyperlipidemia Lifestyle modifications dicussed, will need further vascular work-up  General Counseling: courteney alderete understanding of the findings of todays visit and agrees with plan of treatment. I have discussed any further diagnostic evaluation that may be needed or ordered today. We also reviewed her medications today. she has been encouraged to call the office with any questions or concerns that should arise related to todays visit.    Orders Placed This Encounter  Procedures  . Ambulatory referral to Gastroenterology    Time spent: 30 Minutes Time spent includes review of chart, medications, test results and follow-up plan with the patient.  This patient was seen by [name] AGNP-C in Collaboration with Dr Leeanne Deed as a part of collaborative care agreement     Lyndon Code. Seylah Wernert AGNP-C Internal medicine

## 2020-06-11 ENCOUNTER — Encounter: Payer: Self-pay | Admitting: *Deleted

## 2020-06-13 ENCOUNTER — Encounter: Payer: Self-pay | Admitting: Hospice and Palliative Medicine

## 2020-07-18 ENCOUNTER — Other Ambulatory Visit: Payer: Self-pay

## 2020-07-18 ENCOUNTER — Encounter: Payer: Self-pay | Admitting: Gastroenterology

## 2020-07-18 ENCOUNTER — Ambulatory Visit: Payer: BC Managed Care – PPO | Admitting: Gastroenterology

## 2020-07-18 VITALS — BP 160/100 | HR 105 | Temp 98.3°F | Ht 59.0 in | Wt 187.4 lb

## 2020-07-18 DIAGNOSIS — K5904 Chronic idiopathic constipation: Secondary | ICD-10-CM

## 2020-07-18 DIAGNOSIS — K219 Gastro-esophageal reflux disease without esophagitis: Secondary | ICD-10-CM

## 2020-07-18 MED ORDER — LINACLOTIDE 145 MCG PO CAPS: 145.0000 ug | ORAL_CAPSULE | Freq: Every day | ORAL | 0 refills | Status: DC

## 2020-07-18 NOTE — Progress Notes (Signed)
Arlyss Repress, MD [address]  [address]  [place], [place] [address]  Main: [phone]  Fax: [phone]    Gastroenterology Consultation  Referring Provider:     Theotis Burrow, NP Primary Care Physician:  Lyndon Code, MD Primary Gastroenterologist:  Dr. Arlyss Repress Reason for Consultation:     Chronic constipation        HPI:   Lauren Marsh is a 55 y.o. female referred by Dr. Welton Flakes, Shannan Harper, MD  for consultation & management of chronic constipation.  Patient reports that she has been experiencing irregular bowel habits for several years, has started seeing her PCP recently.  She is taking Linzess samples, has tried a 72 MCG as well as 145 MCG, felt the higher dose is beneficial.  She is taking only as needed.  She does report incomplete emptying, significant straining associated with abdominal bloating.  Her PCP tried to get a diagnostic CT scan which was denied.  Patient denies any rectal bleeding.  She is trying to be more physically active, lose weight by watching her diet as well.  She does have history of chronic GERD, on Protonix 40 mg daily.  She reports difficulty swallowing when she misses the dose.  Her labs are unremarkable  NSAIDs: None  Antiplts/Anticoagulants/Anti thrombotics: None  GI Procedures: EGD and colonoscopy in 2016 at [facility], results not available Patient reports that she had inflammation in the esophagus based on EGD.  Colonoscopy was reportedly normal Patient denies any family history of GI malignancy  Past Medical History:  Diagnosis Date  . GERD (gastroesophageal reflux disease)   . Hypertension     Past Surgical History:  Procedure Laterality Date  . EYE SURGERY      Current Outpatient Medications:  .  acetaminophen (TYLENOL) 500 MG tablet, Take by mouth., Disp: , Rfl:  .  amLODipine (NORVASC) 5 MG tablet, TAKE 1 TABLET (5 MG TOTAL) BY MOUTH DAILY., Disp: 90 tablet, Rfl: 0 .  linaclotide (LINZESS) 145 MCG CAPS capsule,  Take 1 capsule (145 mcg total) by mouth daily before breakfast., Disp: 90 capsule, Rfl: 0 .  pantoprazole (PROTONIX) 40 MG tablet, Take 1 tablet (40 mg total) by mouth daily. Take 1 tablet by mouth daily every morning, Disp: 90 tablet, Rfl: 1 .  TRI-LO-MARZIA 0.18/0.215/0.25 MG-25 MCG tab, Use as directed for 28 days, Disp: 28 tablet, Rfl: 11   Family History  Problem Relation Age of Onset  . Hypertension Mother   . Hypertension Father      Social History   Tobacco Use  . Smoking status: Never Smoker  . Smokeless tobacco: Never Used  Vaping Use  . Vaping Use: Never used  Substance Use Topics  . Alcohol use: Yes    Comment: occasionally  . Drug use: Never    Allergies as of 07/18/2020  . (No Known Allergies)    Review of Systems:    All systems reviewed and negative except where noted in HPI.   Physical Exam:  BP (!) 160/100 (BP Location: Left Arm, Patient Position: Sitting, Cuff Size: Normal)   Pulse (!) 105   Temp 98.3 F (36.8 C) (Oral)   Ht 4\' 11"  (1.499 m)   Wt 187 lb 6 oz (85 kg)   BMI 37.85 kg/m  No LMP recorded.  General:   Alert,  Well-developed, well-nourished, pleasant and cooperative in NAD Head:  Normocephalic and atraumatic. Eyes:  Sclera clear, no icterus.   Conjunctiva pink. Ears:  Normal auditory acuity. Nose:  No deformity, discharge, or lesions. Mouth:  No deformity or lesions,oropharynx pink & moist. Neck:  Supple; no masses or thyromegaly. Lungs:  Respirations even and unlabored.  Clear throughout to auscultation.   No wheezes, crackles, or rhonchi. No acute distress. Heart:  Regular rate and rhythm; no murmurs, clicks, rubs, or gallops. Abdomen:  Normal bowel sounds. Soft, obese, non-tender and non-distended without masses, hepatosplenomegaly or hernias noted.  No guarding or rebound tenderness.   Rectal: Not performed Msk:  Symmetrical without gross deformities. Good, equal movement & strength bilaterally. Pulses:  Normal pulses  noted. Extremities:  No clubbing or edema.  No cyanosis. Neurologic:  Alert and oriented x3;  grossly normal neurologically. Skin:  Intact without significant lesions or rashes. No jaundice. Psych:  Alert and cooperative. Normal mood and affect.  Imaging Studies: Reviewed  Assessment and Plan:   [name] is a 55 y.o. female with obesity, BMI 37.8 is seen in consultation for chronic idiopathic constipation and chronic GERD  Chronic idiopathic constipation Start Linzess 145 MCG, give 90-day supply Discussed about high-fiber diet, adequate intake of water, information provided  Chronic GERD Continue Protonix 40 mg daily Discussed about EGD for screening of Barrett's esophagus and patient is agreeable Continue antireflux lifestyle  I have discussed alternative options, risks & benefits,  which include, but are not limited to, bleeding, infection, perforation,respiratory complication & drug reaction.  The patient agrees with this plan & written consent will be obtained.      Follow up in 3 months   Arlyss Repress, MD

## 2020-07-18 NOTE — Patient Instructions (Signed)
High-Fiber Eating Plan Fiber, also called dietary fiber, is a type of carbohydrate. It is found foods such as fruits, vegetables, whole grains, and beans. A high-fiber diet can have many health benefits. Your health care provider may recommend a high-fiber diet to help:  Prevent constipation. Fiber can make your bowel movements more regular.  Lower your cholesterol.  Relieve the following conditions: ? Inflammation of veins in the anus (hemorrhoids). ? Inflammation of specific areas of the digestive tract (uncomplicated diverticulosis). ? A problem of the large intestine, also called the colon, that sometimes causes pain and diarrhea (irritable bowel syndrome, or IBS).  Prevent overeating as part of a weight-loss plan.  Prevent heart disease, type 2 diabetes, and certain cancers. What are tips for following this plan? Reading food labels  Check the nutrition facts label on food products for the amount of dietary fiber. Choose foods that have 5 grams of fiber or more per serving.  The goals for recommended daily fiber intake include: ? Men (age 50 or younger): 34-38 g. ? Men (over age 50): 28-34 g. ? Women (age 50 or younger): 25-28 g. ? Women (over age 50): 22-25 g. Your daily fiber goal is _____________ g.   Shopping  Choose whole fruits and vegetables instead of processed forms, such as apple juice or applesauce.  Choose a wide variety of high-fiber foods such as avocados, lentils, oats, and kidney beans.  Read the nutrition facts label of the foods you choose. Be aware of foods with added fiber. These foods often have high sugar and sodium amounts per serving. Cooking  Use whole-grain flour for baking and cooking.  Cook with brown rice instead of white rice. Meal planning  Start the day with a breakfast that is high in fiber, such as a cereal that contains 5 g of fiber or more per serving.  Eat breads and cereals that are made with whole-grain flour instead of refined  flour or white flour.  Eat brown rice, bulgur wheat, or millet instead of white rice.  Use beans in place of meat in soups, salads, and pasta dishes.  Be sure that half of the grains you eat each day are whole grains. General information  You can get the recommended daily intake of dietary fiber by: ? Eating a variety of fruits, vegetables, grains, nuts, and beans. ? Taking a fiber supplement if you are not able to take in enough fiber in your diet. It is better to get fiber through food than from a supplement.  Gradually increase how much fiber you consume. If you increase your intake of dietary fiber too quickly, you may have bloating, cramping, or gas.  Drink plenty of water to help you digest fiber.  Choose high-fiber snacks, such as berries, raw vegetables, nuts, and popcorn. What foods should I eat? Fruits Berries. Pears. Apples. Oranges. Avocado. Prunes and raisins. Dried figs. Vegetables Sweet potatoes. Spinach. Kale. Artichokes. Cabbage. Broccoli. Cauliflower. Green peas. Carrots. Squash. Grains Whole-grain breads. Multigrain cereal. Oats and oatmeal. Brown rice. Barley. Bulgur wheat. Millet. Quinoa. Bran muffins. Popcorn. Rye wafer crackers. Meats and other proteins Navy beans, kidney beans, and pinto beans. Soybeans. Split peas. Lentils. Nuts and seeds. Dairy Fiber-fortified yogurt. Beverages Fiber-fortified soy milk. Fiber-fortified orange juice. Other foods Fiber bars. The items listed above may not be a complete list of recommended foods and beverages. Contact a dietitian for more information. What foods should I avoid? Fruits Fruit juice. Cooked, strained fruit. Vegetables Fried potatoes. Canned vegetables. Well-cooked vegetables. Grains   White bread. Pasta made with refined flour. White rice. Meats and other proteins Fatty cuts of meat. Fried chicken or fried fish. Dairy Milk. Yogurt. Cream cheese. Sour cream. Fats and oils Butters. Beverages Soft  drinks. Other foods Cakes and pastries. The items listed above may not be a complete list of foods and beverages to avoid. Talk with your dietitian about what choices are best for you. Summary  Fiber is a type of carbohydrate. It is found in foods such as fruits, vegetables, whole grains, and beans.  A high-fiber diet has many benefits. It can help to prevent constipation, lower blood cholesterol, aid weight loss, and reduce your risk of heart disease, diabetes, and certain cancers.  Increase your intake of fiber gradually. Increasing fiber too quickly may cause cramping, bloating, and gas. Drink plenty of water while you increase the amount of fiber you consume.  The best sources of fiber include whole fruits and vegetables, whole grains, nuts, seeds, and beans. This information is not intended to replace advice given to you by your health care provider. Make sure you discuss any questions you have with your health care provider. Document Revised: 06/02/2019 Document Reviewed: 06/02/2019 Elsevier Patient Education  2021 [place] for Gastroesophageal Reflux Disease, Adult When you have gastroesophageal reflux disease (GERD), the foods you eat and your eating habits are very important. Choosing the right foods can help ease your discomfort. Think about working with a food expert (dietitian) to help you make good choices. What are tips for following this plan? Reading food labels  Look for foods that are low in saturated fat. Foods that may help with your symptoms include: ? Foods that have less than 5% of daily value (DV) of fat. ? Foods that have 0 grams of trans fat. Cooking  Do not fry your food.  Cook your food by baking, steaming, grilling, or broiling. These are all methods that do not need a lot of fat for cooking.  To add flavor, try to use herbs that are low in spice and acidity. Meal planning  Choose healthy foods that are low in fat, such as: ? Fruits  and vegetables. ? Whole grains. ? Low-fat dairy products. ? Lean meats, fish, and poultry.  Eat small meals often instead of eating 3 large meals each day. Eat your meals slowly in a place where you are relaxed. Avoid bending over or lying down until 2-3 hours after eating.  Limit high-fat foods such as fatty meats or fried foods.  Limit your intake of fatty foods, such as oils, butter, and shortening.  Avoid the following as told by your doctor: ? Foods that cause symptoms. These may be different for different people. Keep a food diary to keep track of foods that cause symptoms. ? Alcohol. ? Drinking a lot of liquid with meals. ? Eating meals during the 2-3 hours before bed.   Lifestyle  Stay at a healthy weight. Ask your doctor what weight is healthy for you. If you need to lose weight, work with your doctor to do so safely.  Exercise for at least 30 minutes on 5 or more days each week, or as told by your doctor.  Wear loose-fitting clothes.  Do not smoke or use any products that contain nicotine or tobacco. If you need help quitting, ask your doctor.  Sleep with the head of your bed higher than your feet. Use a wedge under the mattress or blocks under the bed frame to raise the head  of the bed.  Chew sugar-free gum after meals. What foods should eat? Eat a healthy, well-balanced diet of fruits, vegetables, whole grains, low-fat dairy products, lean meats, fish, and poultry. Each person is different. Foods that may cause symptoms in one person may not cause any symptoms in another person. Work with your doctor to find foods that are safe for you. The items listed above may not be a complete list of what you can eat and drink. Contact a food expert for more options.   What foods should I avoid? Limiting some of these foods may help in managing the symptoms of GERD. Everyone is different. Talk with a food expert or your doctor to help you find the exact foods to avoid, if  any. Fruits Any fruits prepared with added fat. Any fruits that cause symptoms. For some people, this may include citrus fruits, such as oranges, grapefruit, pineapple, and lemons. Vegetables Deep-fried vegetables. French fries. Any vegetables prepared with added fat. Any vegetables that cause symptoms. For some people, this may include tomatoes and tomato products, chili peppers, onions and garlic, and horseradish. Grains Pastries or quick breads with added fat. Meats and other proteins High-fat meats, such as fatty beef or pork, hot dogs, ribs, ham, sausage, salami, and bacon. Fried meat or protein, including fried fish and fried chicken. Nuts and nut butters, in large amounts. Dairy Whole milk and chocolate milk. Sour cream. Cream. Ice cream. Cream cheese. Milkshakes. Fats and oils Butter. Margarine. Shortening. Ghee. Beverages Coffee and tea, with or without caffeine. Carbonated beverages. Sodas. Energy drinks. Fruit juice made with acidic fruits, such as orange or grapefruit. Tomato juice. Alcoholic drinks. Sweets and desserts Chocolate and cocoa. Donuts. Seasonings and condiments Pepper. Peppermint and spearmint. Added salt. Any condiments, herbs, or seasonings that cause symptoms. For some people, this may include curry, hot sauce, or vinegar-based salad dressings. The items listed above may not be a complete list of what you should not eat and drink. Contact a food expert for more options. Questions to ask your doctor Diet and lifestyle changes are often the first steps that are taken to manage symptoms of GERD. If diet and lifestyle changes do not help, talk with your doctor about taking medicines. Where to find more information  International Foundation for Gastrointestinal Disorders: aboutgerd.org Summary  When you have GERD, food and lifestyle choices are very important in easing your symptoms.  Eat small meals often instead of 3 large meals a day. Eat your meals slowly and  in a place where you are relaxed.  Avoid bending over or lying down until 2-3 hours after eating.  Limit high-fat foods such as fatty meats or fried foods. This information is not intended to replace advice given to you by your health care provider. Make sure you discuss any questions you have with your health care provider. Document Revised: 08/08/2019 Document Reviewed: 08/08/2019 Elsevier Patient Education  2021 Elsevier Inc.  

## 2020-07-24 ENCOUNTER — Other Ambulatory Visit: Payer: Self-pay

## 2020-07-24 ENCOUNTER — Ambulatory Visit: Payer: BC Managed Care – PPO | Admitting: Anesthesiology

## 2020-07-24 ENCOUNTER — Encounter: Payer: Self-pay | Admitting: Gastroenterology

## 2020-07-24 ENCOUNTER — Ambulatory Visit
Admission: RE | Admit: 2020-07-24 | Discharge: 2020-07-24 | Disposition: A | Discharge status: Home / Self Care | Payer: BC Managed Care – PPO | Attending: Gastroenterology | Admitting: Gastroenterology

## 2020-07-24 ENCOUNTER — Encounter
Admission: RE | Disposition: A | Discharge status: Home / Self Care | Payer: Self-pay | Source: Home / Self Care | Attending: Gastroenterology

## 2020-07-24 DIAGNOSIS — Z79899 Other long term (current) drug therapy: Secondary | ICD-10-CM | POA: Insufficient documentation

## 2020-07-24 DIAGNOSIS — Z1389 Encounter for screening for other disorder: Secondary | ICD-10-CM | POA: Insufficient documentation

## 2020-07-24 DIAGNOSIS — K219 Gastro-esophageal reflux disease without esophagitis: Secondary | ICD-10-CM | POA: Insufficient documentation

## 2020-07-24 DIAGNOSIS — K5909 Other constipation: Secondary | ICD-10-CM | POA: Insufficient documentation

## 2020-07-24 DIAGNOSIS — Z13810 Encounter for screening for upper gastrointestinal disorder: Secondary | ICD-10-CM | POA: Diagnosis not present

## 2020-07-24 HISTORY — PX: ESOPHAGOGASTRODUODENOSCOPY (EGD) WITH PROPOFOL: SHX5813

## 2020-07-24 LAB — POCT PREGNANCY, URINE: Preg Test, Ur: NEGATIVE

## 2020-07-24 SURGERY — ESOPHAGOGASTRODUODENOSCOPY (EGD) WITH PROPOFOL
Anesthesia: General

## 2020-07-24 MED ORDER — PROPOFOL 500 MG/50ML IV EMUL
INTRAVENOUS | Status: AC
  Filled 2020-07-24: qty 50

## 2020-07-24 MED ORDER — PROPOFOL 10 MG/ML IV BOLUS
INTRAVENOUS | Status: DC | PRN
  Administered 2020-07-24: 20 mg via INTRAVENOUS
  Administered 2020-07-24: 100 mg via INTRAVENOUS

## 2020-07-24 MED ORDER — SODIUM CHLORIDE 0.9 % IV SOLN: INTRAVENOUS | Status: DC

## 2020-07-24 MED ORDER — DEXMEDETOMIDINE (PRECEDEX) IN NS 20 MCG/5ML (4 MCG/ML) IV SYRINGE
PREFILLED_SYRINGE | INTRAVENOUS | Status: AC
  Filled 2020-07-24: qty 5

## 2020-07-24 MED ORDER — PROPOFOL 10 MG/ML IV BOLUS
INTRAVENOUS | Status: AC
  Filled 2020-07-24: qty 20

## 2020-07-24 MED ORDER — LIDOCAINE HCL (CARDIAC) PF 100 MG/5ML IV SOSY
PREFILLED_SYRINGE | INTRAVENOUS | Status: DC | PRN
  Administered 2020-07-24: 50 mg via INTRAVENOUS

## 2020-07-24 MED ORDER — LIDOCAINE HCL (PF) 2 % IJ SOLN
INTRAMUSCULAR | Status: AC
  Filled 2020-07-24: qty 5

## 2020-07-24 MED ORDER — PHENYLEPHRINE HCL (PRESSORS) 10 MG/ML IV SOLN
INTRAVENOUS | Status: AC
  Filled 2020-07-24: qty 1

## 2020-07-24 NOTE — H&P (Signed)
Lauren Repress, MD [address]  [address]  [place], [place] [address]  Main: [phone]  Fax: [phone] Pager: [phone]  Primary Care Physician:  Lauren Code, MD Primary Gastroenterologist:  Dr. Arlyss Marsh  Pre-Procedure History & Physical: HPI:  Lauren Marsh is a 55 y.o. female is here for an endoscopy.   Past Medical History:  Diagnosis Date   GERD (gastroesophageal reflux disease)    Hypertension     Past Surgical History:  Procedure Laterality Date   EYE SURGERY      Prior to Admission medications   Medication Sig Start Date End Date Taking? Authorizing Provider  amLODipine (NORVASC) 5 MG tablet TAKE 1 TABLET (5 MG TOTAL) BY MOUTH DAILY. 06/04/20  Yes Lauren Code, MD  linaclotide [facility]) 145 MCG CAPS capsule Take 1 capsule (145 mcg total) by mouth daily before breakfast. 07/18/20  Yes Lauren Marsh, Lauren Dubonnet, MD  pantoprazole (PROTONIX) 40 MG tablet Take 1 tablet (40 mg total) by mouth daily. Take 1 tablet by mouth daily every morning 03/16/20  Yes Lauren Burrow, NP  TRI-LO-MARZIA 0.18/0.215/0.25 MG-25 MCG tab Use as directed for 28 days 03/16/20  Yes Lauren Burrow, NP  acetaminophen (TYLENOL) 500 MG tablet Take by mouth.    [provider]    Allergies as of 07/18/2020   (No Known Allergies)    Family History  Problem Relation Age of Onset   Hypertension Mother    Hypertension Father     Social History   Socioeconomic History   Marital status: Single    Spouse name: Not on file   Number of children: Not on file   Years of education: Not on file   Highest education level: Not on file  Occupational History   Not on file  Tobacco Use   Smoking status: Never   Smokeless tobacco: Never  Vaping Use   Vaping Use: Never used  Substance and Sexual Activity   Alcohol use: Yes    Alcohol/week: 1.0 standard drink    Types: 1 Cans of beer per week    Comment: LAST NOC   Drug use: Never   Sexual activity: Not on file  Other  Topics Concern   Not on file  Social History Narrative   Not on file   Social Determinants of Health   Financial Resource Strain: Not on file  Food Insecurity: Not on file  Transportation Needs: Not on file  Physical Activity: Not on file  Stress: Not on file  Social Connections: Not on file  Intimate Partner Violence: Not on file    Review of Systems: See HPI, otherwise negative ROS  Physical Exam: BP (!) 145/85   Pulse 73   Temp (!) 97.4 F (36.3 C) (Temporal)   Resp 16   Ht 4\' 11"  (1.499 m)   Wt 84.8 kg   LMP 07/11/2020   SpO2 96%   BMI 37.77 kg/m  General:   Alert,  pleasant and cooperative in NAD Head:  Normocephalic and atraumatic. Neck:  Supple; no masses or thyromegaly. Lungs:  Clear throughout to auscultation.    Heart:  Regular rate and rhythm. Abdomen:  Soft, nontender and nondistended. Normal bowel sounds, without guarding, and without rebound.   Neurologic:  Alert and  oriented x4;  grossly normal neurologically.  Impression/Plan: Lauren Marsh is here for an endoscopy to be performed for chronic gerd, barrett's screening  Risks, benefits, limitations, and alternatives regarding  endoscopy have been reviewed  with the patient.  Questions have been answered.  All parties agreeable.   Lauren Donath, MD  07/24/2020, 8:14 AM

## 2020-07-24 NOTE — Anesthesia Preprocedure Evaluation (Signed)
Anesthesia Evaluation  Patient identified by MRN, date of birth, ID band Patient awake    Reviewed: Allergy & Precautions, NPO status , Patient's Chart, lab work & pertinent test results  History of Anesthesia Complications Negative for: history of anesthetic complications  Airway Mallampati: I  TM Distance: >3 FB Neck ROM: Full    Dental no notable dental hx. (+) Teeth Intact   Pulmonary neg pulmonary ROS, neg sleep apnea, neg COPD, Patient abstained from smoking.Not current smoker,    Pulmonary exam normal breath sounds clear to auscultation       Cardiovascular Exercise Tolerance: Good METShypertension, (-) CAD and (-) Past MI (-) dysrhythmias  Rhythm:Regular Rate:Normal - Systolic murmurs    Neuro/Psych  Headaches, negative psych ROS   GI/Hepatic GERD  ,(+)     (-) substance abuse  ,   Endo/Other  neg diabetes  Renal/GU negative Renal ROS     Musculoskeletal   Abdominal   Peds  Hematology   Anesthesia Other Findings Past Medical History: No date: GERD (gastroesophageal reflux disease) No date: Hypertension  Reproductive/Obstetrics                             Anesthesia Physical Anesthesia Plan  ASA: 2  Anesthesia Plan: General   Post-op Pain Management:    Induction: Intravenous  PONV Risk Score and Plan: 3 and Ondansetron, Propofol infusion and TIVA  Airway Management Planned: Nasal Cannula  Additional Equipment: None  Intra-op Plan:   Post-operative Plan:   Informed Consent: I have reviewed the patients History and Physical, chart, labs and discussed the procedure including the risks, benefits and alternatives for the proposed anesthesia with the patient or authorized representative who has indicated his/her understanding and acceptance.     Dental advisory given  Plan Discussed with: CRNA and Surgeon  Anesthesia Plan Comments: (Discussed risks of anesthesia  with patient, including possibility of difficulty with spontaneous ventilation under anesthesia necessitating airway intervention, PONV, and rare risks such as cardiac or respiratory or neurological events. Patient understands.)        Anesthesia Quick Evaluation

## 2020-07-24 NOTE — Op Note (Signed)
[facility] Patient Name: Lauren Marsh Procedure Date: 07/24/2020 6:45 AM MRN: [id number] Account #: [id number] Date of Birth: [birth year] Admit Type: Outpatient Age: 55 Room: [facility] ENDO ROOM 3 Gender: Female Note Status: Finalized Procedure:             Upper GI endoscopy Indications:           Screening procedure, Screening for Barrett's esophagus                         in patient at risk for this condition Providers:             Toney Reil MD, MD Referring MD:          Lyndon Code, MD (Referring MD) Medicines:             General Anesthesia Complications:         No immediate complications. Estimated blood loss: None. Procedure:             Pre-Anesthesia Assessment:                        - Prior to the procedure, a History and Physical was                         performed, and patient medications and allergies were                         reviewed. The patient is competent. The risks and                         benefits of the procedure and the sedation options and                         risks were discussed with the patient. All questions                         were answered and informed consent was obtained.                         Patient identification and proposed procedure were                         verified by the physician, the nurse, the                         anesthesiologist, the anesthetist and the technician                         in the pre-procedure area in the procedure room in the                         endoscopy suite. Mental Status Examination: alert and                         oriented. Airway Examination: normal oropharyngeal                         airway and neck mobility. Respiratory Examination:  clear to auscultation. CV Examination: normal.                         Prophylactic Antibiotics: The patient does not require                         prophylactic antibiotics.  Prior Anticoagulants: The                         patient has taken no previous anticoagulant or                         antiplatelet agents. ASA Grade Assessment: III - A                         patient with severe systemic disease. After reviewing                         the risks and benefits, the patient was deemed in                         satisfactory condition to undergo the procedure. The                         anesthesia plan was to use general anesthesia.                         Immediately prior to administration of medications,                         the patient was re-assessed for adequacy to receive                         sedatives. The heart rate, respiratory rate, oxygen                         saturations, blood pressure, adequacy of pulmonary                         ventilation, and response to care were monitored                         throughout the procedure. The physical status of the                         patient was re-assessed after the procedure.                        After obtaining informed consent, the endoscope was                         passed under direct vision. Throughout the procedure,                         the patient's blood pressure, pulse, and oxygen                         saturations were monitored continuously. The Endoscope  was introduced through the mouth, and advanced to the                         second part of duodenum. The upper GI endoscopy was                         accomplished without difficulty. The patient tolerated                         the procedure well. Findings:      The duodenal bulb and second portion of the duodenum were normal.      The entire examined stomach was normal. Biopsies were taken with a cold       forceps for Helicobacter pylori testing.      The cardia and gastric fundus were normal on retroflexion.      Esophagogastric landmarks were identified: the gastroesophageal junction        was found at 34 cm from the incisors.      The gastroesophageal junction and examined esophagus were normal. Impression:            - Normal duodenal bulb and second portion of the                         duodenum.                        - Normal stomach. Biopsied.                        - Esophagogastric landmarks identified.                        - Normal gastroesophageal junction and esophagus. Recommendation:        - Discharge patient to home (with escort).                        - Resume previous diet today.                        - Continue present medications.                        - Follow an antireflux regimen.                        - Await pathology results. Procedure Code(s):     --- Professional ---                        [phone], Esophagogastroduodenoscopy, flexible,                         transoral; with biopsy, single or multiple Diagnosis Code(s):     --- Professional ---                        Z13.810, Encounter for screening for upper                         gastrointestinal disorder CPT copyright 2019 American Medical Association. All rights reserved. The codes documented in this report are preliminary and upon  coder review may  be revised to meet current compliance requirements. Dr. Libby Maw Toney Reil MD, MD 07/24/2020 8:27:18 AM This report has been signed electronically. Number of Addenda: 0 Note Initiated On: 07/24/2020 6:45 AM Estimated Blood Loss:  Estimated blood loss: none.      [facility]

## 2020-07-24 NOTE — Transfer of Care (Signed)
Immediate Anesthesia Transfer of Care Note  Patient: Lauren Marsh  Procedure(s) Performed: ESOPHAGOGASTRODUODENOSCOPY (EGD) WITH PROPOFOL  Patient Location: PACU and Endoscopy Unit  Anesthesia Type:General  Level of Consciousness: drowsy and patient cooperative  Airway & Oxygen Therapy: Patient Spontanous Breathing  Post-op Assessment: Report given to RN and Post -op Vital signs reviewed and stable  Post vital signs: Reviewed and stable  Last Vitals:  Vitals Value Taken Time  BP 118/82 07/24/20 0831  Temp    Pulse 77 07/24/20 0830  Resp 18 07/24/20 0831  SpO2 97 % 07/24/20 0831  Vitals shown include unvalidated device data.  Last Pain:  Vitals:   07/24/20 0830  TempSrc: Temporal         Complications: No notable events documented.

## 2020-07-24 NOTE — Anesthesia Postprocedure Evaluation (Signed)
Anesthesia Post Note  Patient: Lauren Marsh  Procedure(s) Performed: ESOPHAGOGASTRODUODENOSCOPY (EGD) WITH PROPOFOL  Patient location during evaluation: Endoscopy Anesthesia Type: General Level of consciousness: awake and alert Pain management: pain level controlled Vital Signs Assessment: post-procedure vital signs reviewed and stable Respiratory status: spontaneous breathing, nonlabored ventilation, respiratory function stable and patient connected to nasal cannula oxygen Cardiovascular status: blood pressure returned to baseline and stable Postop Assessment: no apparent nausea or vomiting Anesthetic complications: no   No notable events documented.   Last Vitals:  Vitals:   07/24/20 0840 07/24/20 0850  BP: 123/74 130/74  Pulse: 65 60  Resp: 20 (!) 21  Temp:    SpO2: 100% 100%    Last Pain:  Vitals:   07/24/20 0830  TempSrc: Temporal                 Corinda Gubler

## 2020-07-25 ENCOUNTER — Encounter: Payer: Self-pay | Admitting: Gastroenterology

## 2020-07-26 LAB — SURGICAL PATHOLOGY

## 2020-07-30 ENCOUNTER — Telehealth: Payer: Self-pay

## 2020-07-30 NOTE — Telephone Encounter (Signed)
-----   Message from Toney Reil, MD sent at 07/27/2020  4:11 PM EDT ----- Please inform patient that the pathology results from recent upper endoscopy came back normal  RV

## 2020-07-30 NOTE — Telephone Encounter (Signed)
Thanks for the update.  Let her continue current dose of Linzess for now, she can also try every other day if she is experiencing a lot more days of several episodes of diarrhea daily  RV

## 2020-07-30 NOTE — Telephone Encounter (Signed)
Patient she verbalized understanding. Patient states she is taking the Linzess daily. She states she had a bowel movement on 07/24/2020 after the procedure. She states she she had bloated after when she did not have a bowel movement till Saturday morning. She states Saturday morning she had a bowel movement several times and they were very lose. She states on Sunday she had one liquid bowel movement. Today has not had a bowel movement but does not feel abdominal bloating today.

## 2020-07-30 NOTE — Telephone Encounter (Signed)
Called and left a message for call back  

## 2020-07-30 NOTE — Telephone Encounter (Signed)
Patient verbalized understanding of instructions  

## 2020-08-06 ENCOUNTER — Other Ambulatory Visit: Payer: Self-pay

## 2020-08-06 ENCOUNTER — Encounter: Payer: Self-pay | Admitting: Physician Assistant

## 2020-08-06 ENCOUNTER — Ambulatory Visit: Payer: BC Managed Care – PPO | Admitting: Physician Assistant

## 2020-08-06 DIAGNOSIS — I10 Essential (primary) hypertension: Secondary | ICD-10-CM | POA: Diagnosis not present

## 2020-08-06 DIAGNOSIS — K219 Gastro-esophageal reflux disease without esophagitis: Secondary | ICD-10-CM

## 2020-08-06 DIAGNOSIS — R140 Abdominal distension (gaseous): Secondary | ICD-10-CM | POA: Diagnosis not present

## 2020-08-06 DIAGNOSIS — E782 Mixed hyperlipidemia: Secondary | ICD-10-CM | POA: Diagnosis not present

## 2020-08-06 DIAGNOSIS — K5900 Constipation, unspecified: Secondary | ICD-10-CM | POA: Diagnosis not present

## 2020-08-06 DIAGNOSIS — E669 Obesity, unspecified: Secondary | ICD-10-CM

## 2020-08-06 NOTE — Progress Notes (Signed)
[facility] [address] [place], [place] [address]  Internal MEDICINE  Office Visit Note  Patient Name: Lauren Marsh  [id number]  [id number]  Date of Service: 08/07/2020  Chief Complaint  Patient presents with   Follow-up   Gastroesophageal Reflux   Hypertension   Quality Metric Gaps    Shingrix     HPI Pt is here for routine follow up. -GI did not think CT needed, but did give script for linzess. Also had upper endoscopy and was continued on pantoprazole. Everything looked normal. Has acid reflux if she doesn't take PPI. Goes back to GI in Sept for 26mo follow up. Was given of the Linzess and also taking stool softener. Still does not have BM daily but doesn't feel as bloated. -Cereal, oatmeal, or egg for breakfast. Last time she had a fried egg though she had runny diarrhea that day. Spoke with nurse in GI office and told to switch to qod dosing of Linzess. -BP has been elevated recently at office visits, but pt does not have home cuff to monitor--Will get one now. Will increase to 10mg  amlodipine -Recheck lipids next visit and consider statin  Current Medication: Outpatient Encounter Medications as of 08/06/2020  Medication Sig   acetaminophen (TYLENOL) 500 MG tablet Take by mouth.   amLODipine (NORVASC) 5 MG tablet TAKE 1 TABLET (5 MG TOTAL) BY MOUTH DAILY.   linaclotide (LINZESS) 145 MCG CAPS capsule Take 1 capsule (145 mcg total) by mouth daily before breakfast.   pantoprazole (PROTONIX) 40 MG tablet Take 1 tablet (40 mg total) by mouth daily. Take 1 tablet by mouth daily every morning   TRI-LO-MARZIA 0.18/0.215/0.25 MG-25 MCG tab Use as directed for 28 days   No facility-administered encounter medications on file as of 08/06/2020.    Surgical History: Past Surgical History:  Procedure Laterality Date   ESOPHAGOGASTRODUODENOSCOPY (EGD) WITH PROPOFOL N/A 07/24/2020   Procedure: ESOPHAGOGASTRODUODENOSCOPY (EGD) WITH PROPOFOL;  Surgeon: 07/26/2020, MD;  Location: [facility] ENDOSCOPY;  Service: Gastroenterology;  Laterality: N/A;   EYE SURGERY      Medical History: Past Medical History:  Diagnosis Date   GERD (gastroesophageal reflux disease)    Hypertension     Family History: Family History  Problem Relation Age of Onset   Hypertension Mother    Hypertension Father     Social History   Socioeconomic History   Marital status: Single    Spouse name: Not on file   Number of children: Not on file   Years of education: Not on file   Highest education level: Not on file  Occupational History   Not on file  Tobacco Use   Smoking status: Never   Smokeless tobacco: Never  Vaping Use   Vaping Use: Never used  Substance and Sexual Activity   Alcohol use: Yes    Alcohol/week: 1.0 standard drink    Types: 1 Cans of beer per week    Comment: occ.   Drug use: Never   Sexual activity: Not on file  Other Topics Concern   Not on file  Social History Narrative   Not on file   Social Determinants of Health   Financial Resource Strain: Not on file  Food Insecurity: Not on file  Transportation Needs: Not on file  Physical Activity: Not on file  Stress: Not on file  Social Connections: Not on file  Intimate Partner Violence: Not on file      Review of Systems  Constitutional:  Negative for chills, fatigue and unexpected weight change.  HENT:  Negative for congestion, postnasal drip, rhinorrhea, sneezing and sore throat.   Eyes:  Negative for redness.  Respiratory:  Negative for cough, chest tightness and shortness of breath.   Cardiovascular:  Negative for chest pain and palpitations.  Gastrointestinal:  Positive for abdominal distention and constipation. Negative for abdominal pain, nausea and vomiting.  Genitourinary:  Negative for dysuria and frequency.  Musculoskeletal:  Negative for arthralgias, back pain, joint swelling and neck pain.  Skin:  Negative for rash.  Neurological: Negative.  Negative for  tremors and numbness.  Hematological:  Negative for adenopathy. Does not bruise/bleed easily.  Psychiatric/Behavioral:  Negative for behavioral problems (Depression), sleep disturbance and suicidal ideas. The patient is not nervous/anxious.    Vital Signs: BP (!) 150/86   Pulse 75   Temp (!) 97 F (36.1 C)   Resp 16   Ht 4\' 11"  (1.499 m)   Wt 189 lb 9.6 oz (86 kg)   LMP 07/11/2020   SpO2 99%   BMI 38.29 kg/m    Physical Exam Vitals and nursing note reviewed.  Constitutional:      General: She is not in acute distress.    Appearance: She is well-developed. She is obese. She is not diaphoretic.  HENT:     Head: Normocephalic and atraumatic.     Mouth/Throat:     Pharynx: No oropharyngeal exudate.  Eyes:     Pupils: Pupils are equal, round, and reactive to light.  Neck:     Thyroid: No thyromegaly.     Vascular: No JVD.     Trachea: No tracheal deviation.  Cardiovascular:     Rate and Rhythm: Normal rate and regular rhythm.     Heart sounds: Normal heart sounds. No murmur heard.   No friction rub. No gallop.  Pulmonary:     Effort: Pulmonary effort is normal. No respiratory distress.     Breath sounds: No wheezing or rales.  Chest:     Chest wall: No tenderness.  Abdominal:     General: Bowel sounds are normal. There is no distension.     Palpations: Abdomen is soft.     Tenderness: There is no abdominal tenderness.  Musculoskeletal:        General: Normal range of motion.     Cervical back: Normal range of motion and neck supple.  Lymphadenopathy:     Cervical: No cervical adenopathy.  Skin:    General: Skin is warm and dry.  Neurological:     Mental Status: She is alert and oriented to person, place, and time.     Cranial Nerves: No cranial nerve deficit.  Psychiatric:        Behavior: Behavior normal.        Thought Content: Thought content normal.        Judgment: Judgment normal.       Assessment/Plan: 1. Essential hypertension Elevated in office,  will increase to 10mg  amlodipine and she will monitor at home  2. Mixed hyperlipidemia Wokring on diet and exercise, will recheck labs before next visit and consider statin - Lipid Panel With LDL/HDL Ratio  3. Abdominal distension (gaseous) Improved, followed by GI,  4. Constipation, unspecified constipation type Improving on Linzess, followed by GI  5. Gastroesophageal reflux disease without esophagitis Continue Pantoprazole  6. Obesity (BMI 35.0-39.9 without comorbidity) She is working on making changes to diet and exercise Obesity Counseling: Had a lengthy discussion regarding patients BMI and  weight issues. Patient was instructed on portion control as well as increased activity. Also discussed caloric restrictions with trying to maintain intake less than 2000 Kcal. Discussions were made in accordance with the 5As of weight management. Simple actions such as not eating late and if able to, taking a walk is suggested.    General Counseling: wynonia medero understanding of the findings of todays visit and agrees with plan of treatment. I have discussed any further diagnostic evaluation that may be needed or ordered today. We also reviewed her medications today. she has been encouraged to call the office with any questions or concerns that should arise related to todays visit.    Orders Placed This Encounter  Procedures   Lipid Panel With LDL/HDL Ratio    No orders of the defined types were placed in this encounter.   This patient was seen by Lynn Ito, PA-C in collaboration with Dr. Beverely Risen as a part of collaborative care agreement.   Total time spent:35 Minutes Time spent includes review of chart, medications, test results, and follow up plan with the patient.      Dr Lyndon Code Internal medicine

## 2020-08-17 ENCOUNTER — Telehealth: Payer: Self-pay

## 2020-08-22 ENCOUNTER — Other Ambulatory Visit: Payer: Self-pay

## 2020-08-22 MED ORDER — AMLODIPINE BESYLATE 10 MG PO TABS: 10.0000 mg | ORAL_TABLET | Freq: Every day | ORAL | 1 refills | Status: DC

## 2020-08-22 NOTE — Telephone Encounter (Signed)
Pt advised we change amlodipine 10 mg  and d/c 5 mg her bp is better

## 2020-09-04 ENCOUNTER — Other Ambulatory Visit: Payer: Self-pay | Admitting: Internal Medicine

## 2020-09-04 DIAGNOSIS — I10 Essential (primary) hypertension: Secondary | ICD-10-CM

## 2020-09-26 DIAGNOSIS — E782 Mixed hyperlipidemia: Secondary | ICD-10-CM | POA: Diagnosis not present

## 2020-09-27 LAB — LIPID PANEL WITH LDL/HDL RATIO
Cholesterol, Total: 193 mg/dL (ref 100–199)
HDL: 73 mg/dL (ref >39)
LDL Chol Calc (NIH): 106 mg/dL — ABNORMAL HIGH (ref 0–99)
LDL/HDL Ratio: 1.5 ratio (ref 0.0–3.2)
Triglycerides: 78 mg/dL (ref 0–149)
VLDL Cholesterol Cal: 14 mg/dL (ref 5–40)

## 2020-10-05 ENCOUNTER — Other Ambulatory Visit: Payer: Self-pay

## 2020-10-05 ENCOUNTER — Encounter: Payer: Self-pay | Admitting: Physician Assistant

## 2020-10-05 ENCOUNTER — Ambulatory Visit: Payer: BC Managed Care – PPO | Admitting: Physician Assistant

## 2020-10-05 DIAGNOSIS — L02224 Furuncle of groin: Secondary | ICD-10-CM | POA: Diagnosis not present

## 2020-10-05 DIAGNOSIS — E782 Mixed hyperlipidemia: Secondary | ICD-10-CM

## 2020-10-05 DIAGNOSIS — I10 Essential (primary) hypertension: Secondary | ICD-10-CM

## 2020-10-05 DIAGNOSIS — K219 Gastro-esophageal reflux disease without esophagitis: Secondary | ICD-10-CM

## 2020-10-05 DIAGNOSIS — S39012A Strain of muscle, fascia and tendon of lower back, initial encounter: Secondary | ICD-10-CM

## 2020-10-05 MED ORDER — DOXYCYCLINE HYCLATE 100 MG PO TABS: 100.0000 mg | ORAL_TABLET | Freq: Two times a day (BID) | ORAL | 0 refills | Status: DC

## 2020-10-05 MED ORDER — CYCLOBENZAPRINE HCL 10 MG PO TABS: 10.0000 mg | ORAL_TABLET | Freq: Every day | ORAL | 1 refills | Status: DC

## 2020-10-05 NOTE — Progress Notes (Signed)
[facility] [address] [place], [place] [address]  Internal MEDICINE  Office Visit Note  Patient Name: Lauren Marsh  [id number]  [id number]  Date of Service: 10/05/2020  Chief Complaint  Patient presents with   Follow-up    Back pain/hip pain constant throbbing, sometimes sharp pain shoots down to upper thigh, also has 2 boils on pantie line right side, very tender    Gastroesophageal Reflux   Hypertension    HPI Pt is here for routine follow up -BP at home 120-130s/70s,a little high in office today but patient is also in pain -Right side low back pain with shooting down right leg for the past week. States she has had this before and it has improved with a few days of ibuprofen, but that has not helped this time. -2 boils along panty line and tried neosporin the past 2 nights and has been trying epsom salt bathes to soak to help back too. -trying to walk more and increase fiber and improve diet overall -Cholesterol improved  Current Medication: Outpatient Encounter Medications as of 10/05/2020  Medication Sig   acetaminophen (TYLENOL) 500 MG tablet Take by mouth.   amLODipine (NORVASC) 10 MG tablet Take 1 tablet (10 mg total) by mouth daily.   cyclobenzaprine (FLEXERIL) 10 MG tablet Take 1 tablet (10 mg total) by mouth at bedtime. Take one tab po qhs for back spasm prn only   doxycycline (VIBRA-TABS) 100 MG tablet Take 1 tablet (100 mg total) by mouth 2 (two) times daily.   linaclotide (LINZESS) 145 MCG CAPS capsule Take 1 capsule (145 mcg total) by mouth daily before breakfast.   pantoprazole (PROTONIX) 40 MG tablet Take 1 tablet (40 mg total) by mouth daily. Take 1 tablet by mouth daily every morning   TRI-LO-MARZIA 0.18/0.215/0.25 MG-25 MCG tab Use as directed for 28 days   No facility-administered encounter medications on file as of 10/05/2020.    Surgical History: Past Surgical History:  Procedure Laterality Date   ESOPHAGOGASTRODUODENOSCOPY (EGD)  WITH PROPOFOL N/A 07/24/2020   Procedure: ESOPHAGOGASTRODUODENOSCOPY (EGD) WITH PROPOFOL;  Surgeon: Toney Reil, MD;  Location: [facility] ENDOSCOPY;  Service: Gastroenterology;  Laterality: N/A;   EYE SURGERY      Medical History: Past Medical History:  Diagnosis Date   GERD (gastroesophageal reflux disease)    Hypertension     Family History: Family History  Problem Relation Age of Onset   Hypertension Mother    Hypertension Father     Social History   Socioeconomic History   Marital status: Single    Spouse name: Not on file   Number of children: Not on file   Years of education: Not on file   Highest education level: Not on file  Occupational History   Not on file  Tobacco Use   Smoking status: Never   Smokeless tobacco: Never  Vaping Use   Vaping Use: Never used  Substance and Sexual Activity   Alcohol use: Yes    Alcohol/week: 1.0 standard drink    Types: 1 Cans of beer per week    Comment: occ.   Drug use: Never   Sexual activity: Not on file  Other Topics Concern   Not on file  Social History Narrative   Not on file   Social Determinants of Health   Financial Resource Strain: Not on file  Food Insecurity: Not on file  Transportation Needs: Not on file  Physical Activity: Not on file  Stress: Not on file  Social Connections: Not on file  Intimate Partner Violence: Not on file      Review of Systems  Constitutional:  Negative for chills, fatigue and unexpected weight change.  HENT:  Negative for congestion, postnasal drip, rhinorrhea, sneezing and sore throat.   Eyes:  Negative for redness.  Respiratory:  Negative for cough, chest tightness and shortness of breath.   Cardiovascular:  Negative for chest pain and palpitations.  Gastrointestinal:  Negative for abdominal pain, constipation, diarrhea, nausea and vomiting.  Genitourinary:  Negative for dysuria and frequency.  Musculoskeletal:  Positive for back pain. Negative for arthralgias, joint  swelling and neck pain.  Skin:  Positive for wound. Negative for rash.       2 boils along groin  Neurological: Negative.  Negative for tremors and numbness.  Hematological:  Negative for adenopathy. Does not bruise/bleed easily.  Psychiatric/Behavioral:  Negative for behavioral problems (Depression), sleep disturbance and suicidal ideas. The patient is not nervous/anxious.    Vital Signs: BP (!) 146/69   Pulse 75   Temp (!) 97.3 F (36.3 C)   Resp 16   Ht 4\' 11"  (1.499 m)   Wt 189 lb 12.8 oz (86.1 kg)   SpO2 98%   BMI 38.33 kg/m    Physical Exam Vitals and nursing note reviewed.  Constitutional:      General: She is not in acute distress.    Appearance: She is well-developed. She is obese. She is not diaphoretic.  HENT:     Head: Normocephalic and atraumatic.     Mouth/Throat:     Pharynx: No oropharyngeal exudate.  Eyes:     Pupils: Pupils are equal, round, and reactive to light.  Neck:     Thyroid: No thyromegaly.     Vascular: No JVD.     Trachea: No tracheal deviation.  Cardiovascular:     Rate and Rhythm: Normal rate and regular rhythm.     Heart sounds: Normal heart sounds. No murmur heard.   No friction rub. No gallop.  Pulmonary:     Effort: Pulmonary effort is normal. No respiratory distress.     Breath sounds: No wheezing or rales.  Chest:     Chest wall: No tenderness.  Abdominal:     General: Bowel sounds are normal.     Palpations: Abdomen is soft.  Musculoskeletal:        General: Tenderness present. Normal range of motion.     Cervical back: Normal range of motion and neck supple.     Comments: Pain in right low back worse with sitting long periods and movement  Lymphadenopathy:     Cervical: No cervical adenopathy.  Skin:    General: Skin is warm and dry.  Neurological:     Mental Status: She is alert and oriented to person, place, and time.     Cranial Nerves: No cranial nerve deficit.  Psychiatric:        Behavior: Behavior normal.         Thought Content: Thought content normal.        Judgment: Judgment normal.       Assessment/Plan: 1. Essential hypertension Elevated in office, however patient is in some pain and has been well controlled at home. Will continue currend medication and monitor closely  2. Strain of muscle, fascia and tendon of lower back, initial encounter Will continue ibuprofen and will try flexeril before bed to help relax muscles. Will consider lumbar xray if not improving and may need ortho/PT  referral - cyclobenzaprine (FLEXERIL) 10 MG tablet; Take 1 tablet (10 mg total) by mouth at bedtime. Take one tab po qhs for back spasm prn only  Dispense: 30 tablet; Refill: 1  3. Boil of groin Will start on doxycycline BID and keep area clean and dry. May use warm compress. - doxycycline (VIBRA-TABS) 100 MG tablet; Take 1 tablet (100 mg total) by mouth 2 (two) times daily.  Dispense: 20 tablet; Refill: 0  4. Mixed hyperlipidemia Improved, continue to improve diet and exercise  5. Gastroesophageal reflux disease without esophagitis Stable, continue protonix--followed by GI   General Counseling: [name] understanding of the findings of todays visit and agrees with plan of treatment. I have discussed any further diagnostic evaluation that may be needed or ordered today. We also reviewed her medications today. she has been encouraged to call the office with any questions or concerns that should arise related to todays visit.    No orders of the defined types were placed in this encounter.   Meds ordered this encounter  Medications   cyclobenzaprine (FLEXERIL) 10 MG tablet    Sig: Take 1 tablet (10 mg total) by mouth at bedtime. Take one tab po qhs for back spasm prn only    Dispense:  30 tablet    Refill:  1   doxycycline (VIBRA-TABS) 100 MG tablet    Sig: Take 1 tablet (100 mg total) by mouth 2 (two) times daily.    Dispense:  20 tablet    Refill:  0    This patient was seen by Lynn Ito, PA-C in collaboration with Dr. Beverely Risen as a part of collaborative care agreement.   Total time spent:35 Minutes Time spent includes review of chart, medications, test results, and follow up plan with the patient.      Dr Lyndon Code Internal medicine

## 2020-10-08 ENCOUNTER — Other Ambulatory Visit: Payer: Self-pay

## 2020-10-08 DIAGNOSIS — K219 Gastro-esophageal reflux disease without esophagitis: Secondary | ICD-10-CM

## 2020-10-08 MED ORDER — PANTOPRAZOLE SODIUM 40 MG PO TBEC: 40.0000 mg | DELAYED_RELEASE_TABLET | Freq: Every day | ORAL | 1 refills | Status: DC

## 2020-10-13 ENCOUNTER — Other Ambulatory Visit: Payer: Self-pay | Admitting: Gastroenterology

## 2020-10-23 ENCOUNTER — Other Ambulatory Visit: Payer: Self-pay

## 2020-10-23 ENCOUNTER — Ambulatory Visit: Payer: BC Managed Care – PPO | Admitting: Gastroenterology

## 2020-10-23 ENCOUNTER — Encounter: Payer: Self-pay | Admitting: Gastroenterology

## 2020-10-23 VITALS — BP 142/86 | HR 101 | Temp 98.0°F | Ht 59.0 in | Wt 187.4 lb

## 2020-10-23 DIAGNOSIS — K219 Gastro-esophageal reflux disease without esophagitis: Secondary | ICD-10-CM | POA: Diagnosis not present

## 2020-10-23 DIAGNOSIS — K5904 Chronic idiopathic constipation: Secondary | ICD-10-CM | POA: Diagnosis not present

## 2020-10-23 NOTE — Progress Notes (Signed)
Lauren Repress, MD [address]  [address]  [place], [place] [address]  Main: [phone]  Fax: [phone]    Gastroenterology Consultation  Referring Provider:     Lyndon Code, MD Primary Care Physician:  Lauren Code, MD Primary Gastroenterologist:  Dr. Arlyss Marsh Reason for Consultation:     Chronic constipation        HPI:   Lauren Marsh is a 55 y.o. female referred by Dr. Welton Flakes, Lauren Harper, MD  for consultation & management of chronic constipation.  Patient reports that she has been experiencing irregular bowel habits for several years, has started seeing her PCP recently.  She is taking Linzess samples, has tried a 72 MCG as well as 145 MCG, felt the higher dose is beneficial.  She is taking only as needed.  She does report incomplete emptying, significant straining associated with abdominal bloating.  Her PCP tried to get a diagnostic CT scan which was denied.  Patient denies any rectal bleeding.  She is trying to be more physically active, lose weight by watching her diet as well.  She does have history of chronic GERD, on Protonix 40 mg daily.  She reports difficulty swallowing when she misses the dose.  Her labs are unremarkable  Follow-up visit 10/23/2020 Patient is doing well on Linzess 145 MCG every other day for chronic constipation.  She increased to once a day when she was taking Flexeril for back pain.  She reports that abdominal discomfort, bloating have significantly improved.  She is planning to join a weight loss program.  She is trying to stay active and motivated to lose weight by following healthy lifestyle  NSAIDs: None  Antiplts/Anticoagulants/Anti thrombotics: None  GI Procedures: EGD and colonoscopy in 2016 at [facility], results not available Patient reports that she had inflammation in the esophagus based on EGD.  Colonoscopy was reportedly normal Patient denies any family history of GI malignancy  Upper endoscopy 07/24/2020 - Normal duodenal  bulb and second portion of the duodenum. - Normal stomach. Biopsied. - Esophagogastric landmarks identified. - Normal gastroesophageal junction and esophagus.  DIAGNOSIS:  A.  STOMACH; RANDOM COLD BIOPSY:  - UNREMARKABLE OXYNTIC AND ANTRAL/TRANSITIONAL MUCOSA.  - NEGATIVE FOR H. PYLORI, INTESTINAL METAPLASIA, DYSPLASIA, AND  MALIGNANCY.    Past Medical History:  Diagnosis Date   GERD (gastroesophageal reflux disease)    Hypertension     Past Surgical History:  Procedure Laterality Date   ESOPHAGOGASTRODUODENOSCOPY (EGD) WITH PROPOFOL N/A 07/24/2020   Procedure: ESOPHAGOGASTRODUODENOSCOPY (EGD) WITH PROPOFOL;  Surgeon: Lauren Reil, MD;  Location: [facility] ENDOSCOPY;  Service: Gastroenterology;  Laterality: N/A;   EYE SURGERY      Current Outpatient Medications:    acetaminophen (TYLENOL) 500 MG tablet, Take by mouth., Disp: , Rfl:    amLODipine (NORVASC) 10 MG tablet, Take 1 tablet (10 mg total) by mouth daily., Disp: 90 tablet, Rfl: 1   cyclobenzaprine (FLEXERIL) 10 MG tablet, Take 1 tablet (10 mg total) by mouth at bedtime. Take one tab po qhs for back spasm prn only, Disp: 30 tablet, Rfl: 1   doxycycline (VIBRA-TABS) 100 MG tablet, Take 1 tablet (100 mg total) by mouth 2 (two) times daily., Disp: 20 tablet, Rfl: 0   LINZESS 145 MCG CAPS capsule, TAKE 1 CAPSULE BY MOUTH DAILY BEFORE BREAKFAST., Disp: 90 capsule, Rfl: 0   pantoprazole (PROTONIX) 40 MG tablet, Take 1 tablet (40 mg total) by mouth daily. Take 1 tablet by mouth daily every  morning, Disp: 90 tablet, Rfl: 1   TRI-LO-MARZIA 0.18/0.215/0.25 MG-25 MCG tab, Use as directed for 28 days, Disp: 28 tablet, Rfl: 11   Family History  Problem Relation Age of Onset   Hypertension Mother    Hypertension Father      Social History   Tobacco Use   Smoking status: Never   Smokeless tobacco: Never  Vaping Use   Vaping Use: Never used  Substance Use Topics   Alcohol use: Yes    Alcohol/week: 1.0 standard drink     Types: 1 Cans of beer per week    Comment: occ.   Drug use: Never    Allergies as of 10/23/2020   (No Known Allergies)    Review of Systems:    All systems reviewed and negative except where noted in HPI.   Physical Exam:  BP (!) 142/86 (BP Location: Left Arm, Patient Position: Sitting, Cuff Size: Normal)   Pulse (!) 101   Temp 98 F (36.7 C) (Oral)   Ht 4\' 11"  (1.499 m)   Wt 187 lb 6.4 oz (85 kg)   BMI 37.85 kg/m  No LMP recorded.  General:   Alert,  Well-developed, well-nourished, pleasant and cooperative in NAD Head:  Normocephalic and atraumatic. Eyes:  Sclera clear, no icterus.   Conjunctiva pink. Ears:  Normal auditory acuity. Nose:  No deformity, discharge, or lesions. Mouth:  No deformity or lesions,oropharynx pink & moist. Neck:  Supple; no masses or thyromegaly. Lungs:  Respirations even and unlabored.  Clear throughout to auscultation.   No wheezes, crackles, or rhonchi. No acute distress. Heart:  Regular rate and rhythm; no murmurs, clicks, rubs, or gallops. Abdomen:  Normal bowel sounds. Soft, obese, non-tender and non-distended without masses, hepatosplenomegaly or hernias noted.  No guarding or rebound tenderness.   Rectal: Not performed Msk:  Symmetrical without gross deformities. Good, equal movement & strength bilaterally. Pulses:  Normal pulses noted. Extremities:  No clubbing or edema.  No cyanosis. Neurologic:  Alert and oriented x3;  grossly normal neurologically. Skin:  Intact without significant lesions or rashes. No jaundice. Psych:  Alert and cooperative. Normal mood and affect.  Imaging Studies: Reviewed  Assessment and Plan:   Lauren Marsh is a 55 y.o. female with obesity, BMI 37.8 is seen in consultation for chronic idiopathic constipation and chronic GERD  Chronic idiopathic constipation Continue Linzess 145 MCG Continue high-fiber diet, adequate intake of water  Chronic GERD No evidence of Barrett's esophagus based on  EGD Continue antireflux lifestyle Continue Protonix 40 mg daily before meals before 8   Follow up as needed   59, MD

## 2020-11-05 ENCOUNTER — Encounter: Payer: Self-pay | Admitting: Physician Assistant

## 2020-11-05 ENCOUNTER — Ambulatory Visit: Payer: BC Managed Care – PPO | Admitting: Physician Assistant

## 2020-11-05 ENCOUNTER — Other Ambulatory Visit: Payer: Self-pay

## 2020-11-05 VITALS — BP 130/80 | HR 87 | Temp 97.8°F | Resp 16 | Ht 59.0 in | Wt 187.8 lb

## 2020-11-05 DIAGNOSIS — I10 Essential (primary) hypertension: Secondary | ICD-10-CM | POA: Diagnosis not present

## 2020-11-05 DIAGNOSIS — K5900 Constipation, unspecified: Secondary | ICD-10-CM

## 2020-11-05 DIAGNOSIS — S39012A Strain of muscle, fascia and tendon of lower back, initial encounter: Secondary | ICD-10-CM | POA: Diagnosis not present

## 2020-11-05 NOTE — Progress Notes (Signed)
[facility] [address] [place], [place] [address]  Internal MEDICINE  Office Visit Note  Patient Name: Lauren Marsh  [id number]  [id number]  Date of Service: 11/05/2020  Chief Complaint  Patient presents with   Follow-up   Gastroesophageal Reflux   Hypertension    HPI Pt is here for routine follow up -Back pain is much better, has not needed to take the flexeril recently. Took it for the first 2 weeks but then hasnt needed it. -BP at home not checked recently but has been doing well -Just got back from the beach last week -She is seeing GI, still taking linzess every other day usually and doing well -Boils cleared up  Current Medication: Outpatient Encounter Medications as of 11/05/2020  Medication Sig   acetaminophen (TYLENOL) 500 MG tablet Take by mouth.   amLODipine (NORVASC) 10 MG tablet Take 1 tablet (10 mg total) by mouth daily.   cyclobenzaprine (FLEXERIL) 10 MG tablet Take 1 tablet (10 mg total) by mouth at bedtime. Take one tab po qhs for back spasm prn only   LINZESS 145 MCG CAPS capsule TAKE 1 CAPSULE BY MOUTH DAILY BEFORE BREAKFAST.   pantoprazole (PROTONIX) 40 MG tablet Take 1 tablet (40 mg total) by mouth daily. Take 1 tablet by mouth daily every morning   TRI-LO-MARZIA 0.18/0.215/0.25 MG-25 MCG tab Use as directed for 28 days   [DISCONTINUED] doxycycline (VIBRA-TABS) 100 MG tablet Take 1 tablet (100 mg total) by mouth 2 (two) times daily.   No facility-administered encounter medications on file as of 11/05/2020.    Surgical History: Past Surgical History:  Procedure Laterality Date   ESOPHAGOGASTRODUODENOSCOPY (EGD) WITH PROPOFOL N/A 07/24/2020   Procedure: ESOPHAGOGASTRODUODENOSCOPY (EGD) WITH PROPOFOL;  Surgeon: Toney Reil, MD;  Location: [facility] ENDOSCOPY;  Service: Gastroenterology;  Laterality: N/A;   EYE SURGERY      Medical History: Past Medical History:  Diagnosis Date   GERD (gastroesophageal reflux disease)     Hypertension     Family History: Family History  Problem Relation Age of Onset   Hypertension Mother    Hypertension Father     Social History   Socioeconomic History   Marital status: Single    Spouse name: Not on file   Number of children: Not on file   Years of education: Not on file   Highest education level: Not on file  Occupational History   Not on file  Tobacco Use   Smoking status: Never   Smokeless tobacco: Never  Vaping Use   Vaping Use: Never used  Substance and Sexual Activity   Alcohol use: Yes    Alcohol/week: 1.0 standard drink    Types: 1 Cans of beer per week    Comment: occ.   Drug use: Never   Sexual activity: Not on file  Other Topics Concern   Not on file  Social History Narrative   Not on file   Social Determinants of Health   Financial Resource Strain: Not on file  Food Insecurity: Not on file  Transportation Needs: Not on file  Physical Activity: Not on file  Stress: Not on file  Social Connections: Not on file  Intimate Partner Violence: Not on file      Review of Systems  Constitutional:  Negative for chills, fatigue and unexpected weight change.  HENT:  Negative for congestion, postnasal drip, rhinorrhea, sneezing and sore throat.   Eyes:  Negative for redness.  Respiratory:  Negative for cough, chest tightness and  shortness of breath.   Cardiovascular:  Negative for chest pain and palpitations.  Gastrointestinal:  Negative for abdominal pain, constipation, diarrhea, nausea and vomiting.  Genitourinary:  Negative for dysuria and frequency.  Musculoskeletal:  Negative for arthralgias, back pain, joint swelling and neck pain.  Skin:  Negative for rash.  Neurological: Negative.  Negative for tremors and numbness.  Hematological:  Negative for adenopathy. Does not bruise/bleed easily.  Psychiatric/Behavioral:  Negative for behavioral problems (Depression), sleep disturbance and suicidal ideas. The patient is not nervous/anxious.     Vital Signs: BP 130/80   Pulse 87   Temp 97.8 F (36.6 C)   Resp 16   Ht 4\' 11"  (1.499 m)   Wt 187 lb 12.8 oz (85.2 kg)   SpO2 99%   BMI 37.93 kg/m    Physical Exam Vitals and nursing note reviewed.  Constitutional:      General: She is not in acute distress.    Appearance: She is well-developed. She is obese. She is not diaphoretic.  HENT:     Head: Normocephalic and atraumatic.     Mouth/Throat:     Pharynx: No oropharyngeal exudate.  Eyes:     Pupils: Pupils are equal, round, and reactive to light.  Neck:     Thyroid: No thyromegaly.     Vascular: No JVD.     Trachea: No tracheal deviation.  Cardiovascular:     Rate and Rhythm: Normal rate and regular rhythm.     Heart sounds: Normal heart sounds. No murmur heard.   No friction rub. No gallop.  Pulmonary:     Effort: Pulmonary effort is normal. No respiratory distress.     Breath sounds: No wheezing or rales.  Chest:     Chest wall: No tenderness.  Abdominal:     General: Bowel sounds are normal.     Palpations: Abdomen is soft.  Musculoskeletal:        General: Normal range of motion.     Cervical back: Normal range of motion and neck supple.  Lymphadenopathy:     Cervical: No cervical adenopathy.  Skin:    General: Skin is warm and dry.  Neurological:     Mental Status: She is alert and oriented to person, place, and time.     Cranial Nerves: No cranial nerve deficit.  Psychiatric:        Behavior: Behavior normal.        Thought Content: Thought content normal.        Judgment: Judgment normal.       Assessment/Plan: 1. Essential hypertension Stable, continue current medication  2. Strain of muscle, fascia and tendon of lower back, initial encounter Improved and no longer using flexeril but still has a little left if needed  3. Constipation, unspecified constipation type Followed by GI, on Linzess   General Counseling: understanding of the findings of todays visit and  agrees with plan of treatment. I have discussed any further diagnostic evaluation that may be needed or ordered today. We also reviewed her medications today. she has been encouraged to call the office with any questions or concerns that should arise related to todays visit.    No orders of the defined types were placed in this encounter.   No orders of the defined types were placed in this encounter.   This patient was seen by [name], PA-C in collaboration with Dr. Lynn Ito as a part of collaborative care agreement.   Total time spent:30  Minutes Time spent includes review of chart, medications, test results, and follow up plan with the patient.      Dr Lavera Guise Internal medicine

## 2020-11-30 ENCOUNTER — Ambulatory Visit: Payer: BC Managed Care – PPO | Admitting: Nurse Practitioner

## 2020-11-30 ENCOUNTER — Encounter: Payer: Self-pay | Admitting: Nurse Practitioner

## 2020-11-30 ENCOUNTER — Other Ambulatory Visit: Payer: Self-pay

## 2020-11-30 VITALS — BP 128/82 | HR 96 | Temp 98.0°F | Resp 16 | Ht 59.0 in | Wt 187.4 lb

## 2020-11-30 DIAGNOSIS — R300 Dysuria: Secondary | ICD-10-CM | POA: Diagnosis not present

## 2020-11-30 DIAGNOSIS — N3000 Acute cystitis without hematuria: Secondary | ICD-10-CM | POA: Diagnosis not present

## 2020-11-30 LAB — POCT URINALYSIS DIPSTICK
Bilirubin, UA: NEGATIVE
Glucose, UA: NEGATIVE
Ketones, UA: NEGATIVE
Nitrite, UA: NEGATIVE
Protein, UA: NEGATIVE
Spec Grav, UA: 1.015 (ref 1.010–1.025)
Urobilinogen, UA: 2 E.U./dL — AB
pH, UA: 6 (ref 5.0–8.0)

## 2020-11-30 MED ORDER — NITROFURANTOIN MACROCRYSTAL 100 MG PO CAPS: 100.0000 mg | ORAL_CAPSULE | Freq: Two times a day (BID) | ORAL | 0 refills | Status: AC

## 2020-11-30 NOTE — Progress Notes (Signed)
[facility] [address] [place], [place] [address]  Internal MEDICINE  Office Visit Note  Patient Name: Lauren Marsh  [id number]  [id number]  Date of Service: 11/30/2020  Chief Complaint  Patient presents with   Urinary Tract Infection    Since during the night early morning, some cramping, period started spotting yesterday.  Urine frequency     HPI Shemica presents for an acute sick visit for symptoms of a UTI. She reports frequency, urgency, and urinary discomfort. She is also on her cycle. She has been drinking more water. She has been taking Azo and ibuprofen.    Current Medication:  Outpatient Encounter Medications as of 11/30/2020  Medication Sig   acetaminophen (TYLENOL) 500 MG tablet Take by mouth.   amLODipine (NORVASC) 10 MG tablet Take 1 tablet (10 mg total) by mouth daily.   cyclobenzaprine (FLEXERIL) 10 MG tablet Take 1 tablet (10 mg total) by mouth at bedtime. Take one tab po qhs for back spasm prn only   LINZESS 145 MCG CAPS capsule TAKE 1 CAPSULE BY MOUTH DAILY BEFORE BREAKFAST.   nitrofurantoin (MACRODANTIN) 100 MG capsule Take 1 capsule (100 mg total) by mouth 2 (two) times daily for 7 days.   pantoprazole (PROTONIX) 40 MG tablet Take 1 tablet (40 mg total) by mouth daily. Take 1 tablet by mouth daily every morning   TRI-LO-MARZIA 0.18/0.215/0.25 MG-25 MCG tab Use as directed for 28 days   No facility-administered encounter medications on file as of 11/30/2020.      Medical History: Past Medical History:  Diagnosis Date   GERD (gastroesophageal reflux disease)    Hypertension      Vital Signs: BP 128/82   Pulse 96   Temp 98 F (36.7 C)   Resp 16   Ht 4\' 11"  (1.499 m)   Wt 187 lb 6.4 oz (85 kg)   SpO2 98%   BMI 37.85 kg/m    Review of Systems  Constitutional: Negative.  Negative for chills, fatigue and fever.  HENT: Negative.    Eyes: Negative.   Respiratory: Negative.  Negative for cough, chest tightness, shortness  of breath and wheezing.   Cardiovascular: Negative.  Negative for chest pain and palpitations.  Gastrointestinal: Negative.  Negative for abdominal pain, constipation, diarrhea, nausea and vomiting.  Endocrine: Negative.   Genitourinary:  Positive for difficulty urinating, dysuria, frequency and urgency.  Musculoskeletal: Negative.    Physical Exam Vitals reviewed.  Constitutional:      General: She is not in acute distress.    Appearance: Normal appearance. She is obese. She is not ill-appearing.  HENT:     Head: Normocephalic and atraumatic.  Eyes:     Extraocular Movements: Extraocular movements intact.     Pupils: Pupils are equal, round, and reactive to light.  Cardiovascular:     Rate and Rhythm: Normal rate and regular rhythm.  Pulmonary:     Effort: Pulmonary effort is normal. No respiratory distress.  Abdominal:     Tenderness: There is abdominal tenderness in the suprapubic area.  Neurological:     Mental Status: She is alert and oriented to person, place, and time.     Cranial Nerves: No cranial nerve deficit.     Coordination: Coordination normal.     Gait: Gait normal.  Psychiatric:        Mood and Affect: Mood normal.        Behavior: Behavior normal.      Assessment/Plan: 1. Acute cystitis without hematuria  Empiric antibiotic treatment prescribed - nitrofurantoin (MACRODANTIN) 100 MG capsule; Take 1 capsule (100 mg total) by mouth 2 (two) times daily for 7 days.  Dispense: 14 capsule; Refill: 0  2. Dysuria UA positive for leukocytes and trace blood, sent for culture.  - POCT Urinalysis Dipstick - CULTURE, URINE COMPREHENSIVE   General Counseling: maudy yonan understanding of the findings of todays visit and agrees with plan of treatment. I have discussed any further diagnostic evaluation that may be needed or ordered today. We also reviewed her medications today. she has been encouraged to call the office with any questions or concerns that should  arise related to todays visit.    Counseling:    Orders Placed This Encounter  Procedures   CULTURE, URINE COMPREHENSIVE   POCT Urinalysis Dipstick    Meds ordered this encounter  Medications   nitrofurantoin (MACRODANTIN) 100 MG capsule    Sig: Take 1 capsule (100 mg total) by mouth 2 (two) times daily for 7 days.    Dispense:  14 capsule    Refill:  0    Return if symptoms worsen or fail to improve.  [place] Controlled Substance Database was reviewed by me for overdose risk score (ORS)  Time spent:15 Minutes Time spent with patient included reviewing progress notes, labs, imaging studies, and discussing plan for follow up.   This patient was seen by Sallyanne Kuster, FNP-C in collaboration with Dr. Beverely Risen as a part of collaborative care agreement.  Caylin Raby R. Tedd Sias, MSN, FNP-C Internal Medicine

## 2020-12-03 ENCOUNTER — Ambulatory Visit: Payer: BC Managed Care – PPO | Admitting: Physician Assistant

## 2020-12-06 LAB — CULTURE, URINE COMPREHENSIVE

## 2021-01-11 ENCOUNTER — Other Ambulatory Visit: Payer: Self-pay

## 2021-01-11 ENCOUNTER — Other Ambulatory Visit: Payer: Self-pay | Admitting: Gastroenterology

## 2021-01-11 MED ORDER — LINACLOTIDE 145 MCG PO CAPS: ORAL_CAPSULE | ORAL | 0 refills | Status: DC

## 2021-01-11 NOTE — Progress Notes (Signed)
Sent in refill as requested 

## 2021-02-07 ENCOUNTER — Ambulatory Visit: Payer: BC Managed Care – PPO | Admitting: Physician Assistant

## 2021-02-07 ENCOUNTER — Encounter: Payer: Self-pay | Admitting: Physician Assistant

## 2021-02-07 ENCOUNTER — Other Ambulatory Visit: Payer: Self-pay

## 2021-02-07 DIAGNOSIS — I10 Essential (primary) hypertension: Secondary | ICD-10-CM | POA: Diagnosis not present

## 2021-02-07 DIAGNOSIS — E782 Mixed hyperlipidemia: Secondary | ICD-10-CM | POA: Diagnosis not present

## 2021-02-07 DIAGNOSIS — R5383 Other fatigue: Secondary | ICD-10-CM

## 2021-02-07 DIAGNOSIS — K5900 Constipation, unspecified: Secondary | ICD-10-CM | POA: Diagnosis not present

## 2021-02-07 MED ORDER — AMLODIPINE BESYLATE 10 MG PO TABS: 10.0000 mg | ORAL_TABLET | Freq: Every day | ORAL | 1 refills | Status: DC

## 2021-02-07 NOTE — Progress Notes (Signed)
[facility] [address] [place], [place] [address]  Internal MEDICINE  Office Visit Note  Patient Name: Lauren Marsh  [id number]  [id number]  Date of Service: 02/12/2021  Chief Complaint  Patient presents with   Follow-up   Hypertension    HPI Pt is here for routine follow up and has no complaints today -BP is well controlled, due for refill on Norvasc -Started Optavia in November, has lost 5 lbs since visit in Sept, despite Holidays  -Continues to take linzess and is followed by GI -will order routine fasting labs prior to CPE next visit  Current Medication: Outpatient Encounter Medications as of 02/07/2021  Medication Sig   acetaminophen (TYLENOL) 500 MG tablet Take by mouth.   cyclobenzaprine (FLEXERIL) 10 MG tablet Take 1 tablet (10 mg total) by mouth at bedtime. Take one tab po qhs for back spasm prn only   linaclotide (LINZESS) 145 MCG CAPS capsule TAKE 1 CAPSULE BY MOUTH DAILY BEFORE BREAKFAST.   LINZESS 145 MCG CAPS capsule TAKE 1 CAPSULE BY MOUTH EVERY DAY BEFORE BREAKFAST   pantoprazole (PROTONIX) 40 MG tablet Take 1 tablet (40 mg total) by mouth daily. Take 1 tablet by mouth daily every morning   TRI-LO-MARZIA 0.18/0.215/0.25 MG-25 MCG tab Use as directed for 28 days   [DISCONTINUED] amLODipine (NORVASC) 10 MG tablet Take 1 tablet (10 mg total) by mouth daily.   amLODipine (NORVASC) 10 MG tablet Take 1 tablet (10 mg total) by mouth daily.   No facility-administered encounter medications on file as of 02/07/2021.    Surgical History: Past Surgical History:  Procedure Laterality Date   ESOPHAGOGASTRODUODENOSCOPY (EGD) WITH PROPOFOL N/A 07/24/2020   Procedure: ESOPHAGOGASTRODUODENOSCOPY (EGD) WITH PROPOFOL;  Surgeon: Toney Reil, MD;  Location: [facility] ENDOSCOPY;  Service: Gastroenterology;  Laterality: N/A;   EYE SURGERY      Medical History: Past Medical History:  Diagnosis Date   GERD (gastroesophageal reflux disease)    Hypertension      Family History: Family History  Problem Relation Age of Onset   Hypertension Mother    Hypertension Father     Social History   Socioeconomic History   Marital status: Single    Spouse name: Not on file   Number of children: Not on file   Years of education: Not on file   Highest education level: Not on file  Occupational History   Not on file  Tobacco Use   Smoking status: Never   Smokeless tobacco: Never  Vaping Use   Vaping Use: Never used  Substance and Sexual Activity   Alcohol use: Yes    Alcohol/week: 1.0 standard drink    Types: 1 Cans of beer per week    Comment: occ.   Drug use: Never   Sexual activity: Not on file  Other Topics Concern   Not on file  Social History Narrative   Not on file   Social Determinants of Health   Financial Resource Strain: Not on file  Food Insecurity: Not on file  Transportation Needs: Not on file  Physical Activity: Not on file  Stress: Not on file  Social Connections: Not on file  Intimate Partner Violence: Not on file      Review of Systems  Constitutional:  Negative for chills, fatigue and unexpected weight change.  HENT:  Negative for congestion, postnasal drip, rhinorrhea, sneezing and sore throat.   Eyes:  Negative for redness.  Respiratory:  Negative for cough, chest tightness and shortness of breath.  Cardiovascular:  Negative for chest pain and palpitations.  Gastrointestinal:  Negative for abdominal pain, constipation, diarrhea, nausea and vomiting.  Genitourinary:  Negative for dysuria and frequency.  Musculoskeletal:  Negative for arthralgias, back pain, joint swelling and neck pain.  Skin:  Negative for rash.  Neurological: Negative.  Negative for tremors and numbness.  Hematological:  Negative for adenopathy. Does not bruise/bleed easily.  Psychiatric/Behavioral:  Negative for behavioral problems (Depression), sleep disturbance and suicidal ideas. The patient is not nervous/anxious.    Vital  Signs: BP 137/74    Pulse 85    Temp 97.8 F (36.6 C)    Resp 16    Ht 4\' 11"  (1.499 m)    Wt 182 lb (82.6 kg)    SpO2 98%    BMI 36.76 kg/m    Physical Exam Vitals and nursing note reviewed.  Constitutional:      General: She is not in acute distress.    Appearance: She is well-developed. She is obese. She is not diaphoretic.  HENT:     Head: Normocephalic and atraumatic.     Mouth/Throat:     Pharynx: No oropharyngeal exudate.  Eyes:     Pupils: Pupils are equal, round, and reactive to light.  Neck:     Thyroid: No thyromegaly.     Vascular: No JVD.     Trachea: No tracheal deviation.  Cardiovascular:     Rate and Rhythm: Normal rate and regular rhythm.     Heart sounds: Normal heart sounds. No murmur heard.   No friction rub. No gallop.  Pulmonary:     Effort: Pulmonary effort is normal. No respiratory distress.     Breath sounds: No wheezing or rales.  Chest:     Chest wall: No tenderness.  Abdominal:     General: Bowel sounds are normal.     Palpations: Abdomen is soft.  Musculoskeletal:        General: Normal range of motion.     Cervical back: Normal range of motion and neck supple.  Lymphadenopathy:     Cervical: No cervical adenopathy.  Skin:    General: Skin is warm and dry.  Neurological:     Mental Status: She is alert and oriented to person, place, and time.     Cranial Nerves: No cranial nerve deficit.  Psychiatric:        Behavior: Behavior normal.        Thought Content: Thought content normal.        Judgment: Judgment normal.       Assessment/Plan: 1. Essential hypertension Well controlled, continue current medication--refill sent - amLODipine (NORVASC) 10 MG tablet; Take 1 tablet (10 mg total) by mouth daily.  Dispense: 90 tablet; Refill: 1  2. Constipation, unspecified constipation type Continue Linzess, followed by GI  3. Mixed hyperlipidemia - Lipid Panel With LDL/HDL Ratio  4. Other fatigue - CBC w/Diff/Platelet - Comprehensive  metabolic panel - TSH + free T4   General Counseling: modest draeger understanding of the findings of todays visit and agrees with plan of treatment. I have discussed any further diagnostic evaluation that may be needed or ordered today. We also reviewed her medications today. she has been encouraged to call the office with any questions or concerns that should arise related to todays visit.    Orders Placed This Encounter  Procedures   CBC w/Diff/Platelet   Comprehensive metabolic panel   Lipid Panel With LDL/HDL Ratio   TSH + free T4  Meds ordered this encounter  Medications   amLODipine (NORVASC) 10 MG tablet    Sig: Take 1 tablet (10 mg total) by mouth daily.    Dispense:  90 tablet    Refill:  1    This patient was seen by Lynn Ito, PA-C in collaboration with Dr. Beverely Risen as a part of collaborative care agreement.   Total time spent:30 Minutes Time spent includes review of chart, medications, test results, and follow up plan with the patient.      Dr Lyndon Code Internal medicine

## 2021-03-31 ENCOUNTER — Other Ambulatory Visit: Payer: Self-pay | Admitting: Physician Assistant

## 2021-03-31 DIAGNOSIS — K219 Gastro-esophageal reflux disease without esophagitis: Secondary | ICD-10-CM

## 2021-04-09 ENCOUNTER — Other Ambulatory Visit: Payer: Self-pay

## 2021-04-09 DIAGNOSIS — Z3041 Encounter for surveillance of contraceptive pills: Secondary | ICD-10-CM

## 2021-04-09 MED ORDER — TRI-LO-MARZIA 0.18/0.215/0.25 MG-25 MCG PO TABS: ORAL_TABLET | ORAL | 5 refills | Status: DC

## 2021-04-14 ENCOUNTER — Other Ambulatory Visit: Payer: Self-pay | Admitting: Gastroenterology

## 2021-04-17 DIAGNOSIS — R5383 Other fatigue: Secondary | ICD-10-CM | POA: Diagnosis not present

## 2021-04-17 DIAGNOSIS — E782 Mixed hyperlipidemia: Secondary | ICD-10-CM | POA: Diagnosis not present

## 2021-04-18 LAB — CBC WITH DIFFERENTIAL/PLATELET
Basophils Absolute: 0 10*3/uL (ref 0.0–0.2)
Basos: 1 %
EOS (ABSOLUTE): 0.3 10*3/uL (ref 0.0–0.4)
Eos: 5 %
Hematocrit: 38.8 % (ref 34.0–46.6)
Hemoglobin: 13.2 g/dL (ref 11.1–15.9)
Immature Grans (Abs): 0 10*3/uL (ref 0.0–0.1)
Immature Granulocytes: 0 %
Lymphocytes Absolute: 1.3 10*3/uL (ref 0.7–3.1)
Lymphs: 24 %
MCH: 30.4 pg (ref 26.6–33.0)
MCHC: 34 g/dL (ref 31.5–35.7)
MCV: 89 fL (ref 79–97)
Monocytes Absolute: 0.5 10*3/uL (ref 0.1–0.9)
Monocytes: 9 %
Neutrophils Absolute: 3.3 10*3/uL (ref 1.4–7.0)
Neutrophils: 61 %
Platelets: 283 10*3/uL (ref 150–450)
RBC: 4.34 x10E6/uL (ref 3.77–5.28)
RDW: 12.3 % (ref 11.7–15.4)
WBC: 5.4 10*3/uL (ref 3.4–10.8)

## 2021-04-18 LAB — COMPREHENSIVE METABOLIC PANEL
ALT: 18 IU/L (ref 0–32)
AST: 19 IU/L (ref 0–40)
Albumin/Globulin Ratio: 1.9 (ref 1.2–2.2)
Albumin: 4.1 g/dL (ref 3.8–4.9)
Alkaline Phosphatase: 47 IU/L (ref 44–121)
BUN/Creatinine Ratio: 15 (ref 9–23)
BUN: 10 mg/dL (ref 6–24)
Bilirubin Total: 0.3 mg/dL (ref 0.0–1.2)
CO2: 21 mmol/L (ref 20–29)
Calcium: 8.8 mg/dL (ref 8.7–10.2)
Chloride: 107 mmol/L — ABNORMAL HIGH (ref 96–106)
Creatinine, Ser: 0.68 mg/dL (ref 0.57–1.00)
Globulin, Total: 2.2 g/dL (ref 1.5–4.5)
Glucose: 91 mg/dL (ref 70–99)
Potassium: 4.1 mmol/L (ref 3.5–5.2)
Sodium: 142 mmol/L (ref 134–144)
Total Protein: 6.3 g/dL (ref 6.0–8.5)
eGFR: 102 mL/min/{1.73_m2} (ref >59)

## 2021-04-18 LAB — LIPID PANEL WITH LDL/HDL RATIO
Cholesterol, Total: 217 mg/dL — ABNORMAL HIGH (ref 100–199)
HDL: 73 mg/dL (ref >39)
LDL Chol Calc (NIH): 132 mg/dL — ABNORMAL HIGH (ref 0–99)
LDL/HDL Ratio: 1.8 ratio (ref 0.0–3.2)
Triglycerides: 70 mg/dL (ref 0–149)
VLDL Cholesterol Cal: 12 mg/dL (ref 5–40)

## 2021-04-18 LAB — TSH+FREE T4
Free T4: 1.06 ng/dL (ref 0.82–1.77)
TSH: 2.12 u[IU]/mL (ref 0.450–4.500)

## 2021-04-19 ENCOUNTER — Other Ambulatory Visit: Payer: Self-pay

## 2021-04-19 ENCOUNTER — Encounter: Payer: Self-pay | Admitting: Physician Assistant

## 2021-04-19 ENCOUNTER — Ambulatory Visit (INDEPENDENT_AMBULATORY_CARE_PROVIDER_SITE_OTHER): Payer: BC Managed Care – PPO | Admitting: Physician Assistant

## 2021-04-19 DIAGNOSIS — Z0001 Encounter for general adult medical examination with abnormal findings: Secondary | ICD-10-CM | POA: Diagnosis not present

## 2021-04-19 DIAGNOSIS — Z1231 Encounter for screening mammogram for malignant neoplasm of breast: Secondary | ICD-10-CM

## 2021-04-19 DIAGNOSIS — K5900 Constipation, unspecified: Secondary | ICD-10-CM | POA: Diagnosis not present

## 2021-04-19 DIAGNOSIS — E782 Mixed hyperlipidemia: Secondary | ICD-10-CM | POA: Diagnosis not present

## 2021-04-19 DIAGNOSIS — Z01419 Encounter for gynecological examination (general) (routine) without abnormal findings: Secondary | ICD-10-CM

## 2021-04-19 DIAGNOSIS — R300 Dysuria: Secondary | ICD-10-CM

## 2021-04-19 DIAGNOSIS — I10 Essential (primary) hypertension: Secondary | ICD-10-CM

## 2021-04-19 MED ORDER — ROSUVASTATIN CALCIUM 5 MG PO TABS: ORAL_TABLET | ORAL | 3 refills | Status: DC

## 2021-04-19 NOTE — Progress Notes (Cosign Needed)
[facility] [place], [place] [address]  Internal MEDICINE  Office Visit Note  Patient Name: Lauren Marsh  [id number]  [id number]  Date of Service: 04/19/2021  Chief Complaint  Patient presents with   Annual Exam   Gastroesophageal Reflux   Hypertension   Medication Refill    Wondering if we can slow down refills for Linzess, she only takes it every other day     HPI Pt is here for routine health maintenance examination -Was taking Linzess every other day but is still a little runny so will slow down and maybe try every 3rd day -Reviewed labs which showed elevated cholesterol again, had been working on diet. Discussed starting crestor twice per week and considering fish oil. -GERD well controlled with pantoprazole -Still doing optavia diet -has a 20moold puppy which is helping her take breaks while working from home in order to get some exercise.  States she will go out in the backyard and play with the dog -BP well controlled -Up-to-date on colonoscopy, due for mammogram  Current Medication: Outpatient Encounter Medications as of 04/19/2021  Medication Sig   acetaminophen (TYLENOL) 500 MG tablet Take by mouth.   amLODipine (NORVASC) 10 MG tablet Take 1 tablet (10 mg total) by mouth daily.   cyclobenzaprine (FLEXERIL) 10 MG tablet Take 1 tablet (10 mg total) by mouth at bedtime. Take one tab po qhs for back spasm prn only   linaclotide (LINZESS) 145 MCG CAPS capsule TAKE 1 CAPSULE BY MOUTH EVERY DAY BEFORE BREAKFAST   LINZESS 145 MCG CAPS capsule TAKE 1 CAPSULE BY MOUTH EVERY DAY BEFORE BREAKFAST   pantoprazole (PROTONIX) 40 MG tablet TAKE 1 TABLET BY MOUTH IN THE MORNING   rosuvastatin (CRESTOR) 5 MG tablet Take 1 tablet by mouth at night twice per week   TRI-LO-MARZIA 0.18/0.215/0.25 MG-25 MCG tab Use as directed for 28 days   No facility-administered encounter medications on file as of 04/19/2021.    Surgical History: Past Surgical  History:  Procedure Laterality Date   ESOPHAGOGASTRODUODENOSCOPY (EGD) WITH PROPOFOL N/A 07/24/2020   Procedure: ESOPHAGOGASTRODUODENOSCOPY (EGD) WITH PROPOFOL;  Surgeon: VLin Landsman MD;  [facility];  Service: Gastroenterology;  Laterality: N/A;   EYE SURGERY      Medical History: Past Medical History:  Diagnosis Date   GERD (gastroesophageal reflux disease)    Hypertension     Family History: Family History  Problem Relation Age of Onset   Hypertension Mother    Hypertension Father       Review of Systems  Constitutional:  Negative for chills, fatigue and unexpected weight change.  HENT:  Negative for congestion, postnasal drip, rhinorrhea, sneezing and sore throat.   Eyes:  Negative for redness.  Respiratory:  Negative for cough, chest tightness and shortness of breath.   Cardiovascular:  Negative for chest pain and palpitations.  Gastrointestinal:  Negative for abdominal pain, constipation, diarrhea, nausea and vomiting.  Genitourinary:  Negative for dysuria and frequency.  Musculoskeletal:  Negative for arthralgias, back pain, joint swelling and neck pain.  Skin:  Negative for rash.  Neurological: Negative.  Negative for tremors and numbness.  Hematological:  Negative for adenopathy. Does not bruise/bleed easily.  Psychiatric/Behavioral:  Negative for behavioral problems (Depression), sleep disturbance and suicidal ideas. The patient is not nervous/anxious.     Vital Signs: BP 116/73    Pulse 75    Temp 98.3 F (36.8 C)    Resp 16    Ht 4'  11" (1.499 m)    Wt 177 lb 12.8 oz (80.6 kg)    SpO2 99%    BMI 35.91 kg/m    Physical Exam Vitals and nursing note reviewed.  Constitutional:      General: She is not in acute distress.    Appearance: She is well-developed. She is obese. She is not diaphoretic.  HENT:     Head: Normocephalic and atraumatic.     Mouth/Throat:     Pharynx: No oropharyngeal exudate.  Eyes:     Pupils: Pupils are equal,  round, and reactive to light.  Neck:     Thyroid: No thyromegaly.     Vascular: No JVD.     Trachea: No tracheal deviation.  Cardiovascular:     Rate and Rhythm: Normal rate and regular rhythm.     Heart sounds: Normal heart sounds. No murmur heard.   No friction rub. No gallop.  Pulmonary:     Effort: Pulmonary effort is normal. No respiratory distress.     Breath sounds: No wheezing or rales.  Chest:     Chest wall: No tenderness.  Breasts:    Right: Normal. No mass.     Left: Normal. No mass.  Abdominal:     General: Bowel sounds are normal.     Palpations: Abdomen is soft.     Tenderness: There is no abdominal tenderness.  Musculoskeletal:        General: Normal range of motion.     Cervical back: Normal range of motion and neck supple.     Right lower leg: No edema.     Left lower leg: No edema.  Lymphadenopathy:     Cervical: No cervical adenopathy.  Skin:    General: Skin is warm and dry.  Neurological:     Mental Status: She is alert and oriented to person, place, and time.     Cranial Nerves: No cranial nerve deficit.  Psychiatric:        Behavior: Behavior normal.        Thought Content: Thought content normal.        Judgment: Judgment normal.     LABS: Recent Results (from the past 2160 hour(s))  CBC w/Diff/Platelet     Status: None   Collection Time: 04/17/21  7:14 AM  Result Value Ref Range   WBC 5.4 3.4 - 10.8 x10E3/uL   RBC 4.34 3.77 - 5.28 x10E6/uL   Hemoglobin 13.2 11.1 - 15.9 g/dL   Hematocrit 38.8 34.0 - 46.6 %   MCV 89 79 - 97 fL   MCH 30.4 26.6 - 33.0 pg   MCHC 34.0 31.5 - 35.7 g/dL   RDW 12.3 11.7 - 15.4 %   Platelets 283 150 - 450 x10E3/uL   Neutrophils 61 Not Estab. %   Lymphs 24 Not Estab. %   Monocytes 9 Not Estab. %   Eos 5 Not Estab. %   Basos 1 Not Estab. %   Neutrophils Absolute 3.3 1.4 - 7.0 x10E3/uL   Lymphocytes Absolute 1.3 0.7 - 3.1 x10E3/uL   Monocytes Absolute 0.5 0.1 - 0.9 x10E3/uL   EOS (ABSOLUTE) 0.3 0.0 - 0.4  x10E3/uL   Basophils Absolute 0.0 0.0 - 0.2 x10E3/uL   Immature Granulocytes 0 Not Estab. %   Immature Grans (Abs) 0.0 0.0 - 0.1 x10E3/uL  Comprehensive metabolic panel     Status: Abnormal   Collection Time: 04/17/21  7:14 AM  Result Value Ref Range   Glucose 91 70 -  99 mg/dL   BUN 10 6 - 24 mg/dL   Creatinine, Ser 0.68 0.57 - 1.00 mg/dL   eGFR 102 >59 mL/min/1.73   BUN/Creatinine Ratio 15 9 - 23   Sodium 142 134 - 144 mmol/L   Potassium 4.1 3.5 - 5.2 mmol/L   Chloride 107 (H) 96 - 106 mmol/L   CO2 21 20 - 29 mmol/L   Calcium 8.8 8.7 - 10.2 mg/dL   Total Protein 6.3 6.0 - 8.5 g/dL   Albumin 4.1 3.8 - 4.9 g/dL   Globulin, Total 2.2 1.5 - 4.5 g/dL   Albumin/Globulin Ratio 1.9 1.2 - 2.2   Bilirubin Total 0.3 0.0 - 1.2 mg/dL   Alkaline Phosphatase 47 44 - 121 IU/L   AST 19 0 - 40 IU/L   ALT 18 0 - 32 IU/L  Lipid Panel With LDL/HDL Ratio     Status: Abnormal   Collection Time: 04/17/21  7:14 AM  Result Value Ref Range   Cholesterol, Total 217 (H) 100 - 199 mg/dL   Triglycerides 70 0 - 149 mg/dL   HDL 73 >39 mg/dL   VLDL Cholesterol Cal 12 5 - 40 mg/dL   LDL Chol Calc (NIH) 132 (H) 0 - 99 mg/dL   LDL/HDL Ratio 1.8 0.0 - 3.2 ratio    Comment:                                     LDL/HDL Ratio                                             Men  Women                               1/2 Avg.Risk  1.0    1.5                                   Avg.Risk  3.6    3.2                                2X Avg.Risk  6.2    5.0                                3X Avg.Risk  8.0    6.1   TSH + free T4     Status: None   Collection Time: 04/17/21  7:14 AM  Result Value Ref Range   TSH 2.120 0.450 - 4.500 uIU/mL   Free T4 1.06 0.82 - 1.77 ng/dL       Assessment/Plan: 1. Encounter for general adult medical examination with abnormal findings CPE performed, routine fasting labs reviewed, mammogram ordered, up-to-date on colonoscopy  2. Essential hypertension Stable, continue current  medications  3. Constipation, unspecified constipation type May decrease frequency of Linzess to every third day and monitor response.  May be able to decrease use if bowel movements appear to be more regular again without use  4. Mixed hyperlipidemia Diet and exercise and started on Crestor twice per week.  We will  start on fish oil supplement daily.  Did discuss carotid ultrasound for further investigation however would like to hold off on this time and monitor labs - rosuvastatin (CRESTOR) 5 MG tablet; Take 1 tablet by mouth at night twice per week  Dispense: 30 tablet; Refill: 3  5. Visit for screening mammogram - MM DIGITAL SCREENING BILATERAL; Future  6. Dysuria - UA/M w/rflx Culture, Routine 7.  Visit for gynecologic examination Breast exam performed  General Counseling: Lauren Marsh understanding of the findings of todays visit and agrees with plan of treatment. I have discussed any further diagnostic evaluation that may be needed or ordered today. We also reviewed her medications today. she has been encouraged to call the office with any questions or concerns that should arise related to todays visit.    Counseling:    Orders Placed This Encounter  Procedures   MM DIGITAL SCREENING BILATERAL   UA/M w/rflx Culture, Routine    Meds ordered this encounter  Medications   rosuvastatin (CRESTOR) 5 MG tablet    Sig: Take 1 tablet by mouth at night twice per week    Dispense:  30 tablet    Refill:  3    This patient was seen by Drema Dallas, PA-C in collaboration with Dr. Clayborn Bigness as a part of collaborative care agreement.  Total time spent:35 Minutes  Time spent includes review of chart, medications, test results, and follow up plan with the patient.     Lavera Guise, MD  Internal Medicine

## 2021-05-02 DIAGNOSIS — Z1231 Encounter for screening mammogram for malignant neoplasm of breast: Secondary | ICD-10-CM | POA: Diagnosis not present

## 2021-05-06 ENCOUNTER — Encounter: Payer: Self-pay | Admitting: Physician Assistant

## 2021-05-06 ENCOUNTER — Other Ambulatory Visit: Payer: Self-pay

## 2021-05-06 ENCOUNTER — Ambulatory Visit: Payer: BC Managed Care – PPO | Admitting: Physician Assistant

## 2021-05-06 DIAGNOSIS — R300 Dysuria: Secondary | ICD-10-CM

## 2021-05-06 DIAGNOSIS — G4700 Insomnia, unspecified: Secondary | ICD-10-CM | POA: Diagnosis not present

## 2021-05-06 LAB — POCT URINALYSIS DIPSTICK
Glucose, UA: NEGATIVE
Nitrite, UA: NEGATIVE
Protein, UA: POSITIVE — AB
Spec Grav, UA: 1.015 (ref 1.010–1.025)
Urobilinogen, UA: 0.2 E.U./dL
pH, UA: 5 (ref 5.0–8.0)

## 2021-05-06 MED ORDER — DOXYCYCLINE HYCLATE 100 MG PO TABS: 100.0000 mg | ORAL_TABLET | Freq: Two times a day (BID) | ORAL | 0 refills | Status: DC

## 2021-05-06 MED ORDER — TRAZODONE HCL 50 MG PO TABS: 50.0000 mg | ORAL_TABLET | Freq: Every day | ORAL | 2 refills | Status: DC

## 2021-05-06 NOTE — Progress Notes (Signed)
?[facility] ?[address] ?[place], [place] [address] ? ?Internal MEDICINE  ?Office Visit Note ? ?Patient Name: Lauren Marsh ? [id number]  ?[id number] ? ?Date of Service: 05/07/2021 ? ?Chief Complaint  ?Patient presents with  ? Urinary Tract Infection  ?  Chills, headache, sweats, no fever, lower back pain, urine frequency, pressure  ? ? ? ?HPI ?Pt is here for a sick visit. ?-Did have Uti symptoms last Saturday and did AZO and did better. Then this past Friday was outside and sat on woodpile and then that night started having back pain. The next morning started having chills, and starting with urinary frequency and lower abdominal pain. Saturday did not have a fever, but would have chills and then break out into sweats and is impacting sleep. Thought maybe some menopausal symptoms but is on birth control for this. Having some headaches as well. ?-States she did not see any ticks on her, and did not notice any bite marks or rashes. No joint pain. ? ?Current Medication: ? ?Outpatient Encounter Medications as of 05/06/2021  ?Medication Sig  ? acetaminophen (TYLENOL) 500 MG tablet Take by mouth.  ? amLODipine (NORVASC) 10 MG tablet Take 1 tablet (10 mg total) by mouth daily.  ? cyclobenzaprine (FLEXERIL) 10 MG tablet Take 1 tablet (10 mg total) by mouth at bedtime. Take one tab po qhs for back spasm prn only  ? doxycycline (VIBRA-TABS) 100 MG tablet Take 1 tablet (100 mg total) by mouth 2 (two) times daily.  ? linaclotide (LINZESS) 145 MCG CAPS capsule TAKE 1 CAPSULE BY MOUTH EVERY DAY BEFORE BREAKFAST  ? LINZESS 145 MCG CAPS capsule TAKE 1 CAPSULE BY MOUTH EVERY DAY BEFORE BREAKFAST  ? pantoprazole (PROTONIX) 40 MG tablet TAKE 1 TABLET BY MOUTH IN THE MORNING  ? rosuvastatin (CRESTOR) 5 MG tablet Take 1 tablet by mouth at night twice per week  ? traZODone (DESYREL) 50 MG tablet Take 1 tablet (50 mg total) by mouth at bedtime.  ? TRI-LO-MARZIA 0.18/0.215/0.25 MG-25 MCG tab Use as directed for 28 days   ? ?No facility-administered encounter medications on file as of 05/06/2021.  ? ? ? ? ?Medical History: ?Past Medical History:  ?Diagnosis Date  ? GERD (gastroesophageal reflux disease)   ? Hypertension   ? ? ? ?Vital Signs: ?BP 122/73   Pulse (!) 123   Temp 98 ?F (36.7 ?C)   Resp 16   Ht 4\' 11"  (1.499 m)   Wt 174 lb (78.9 kg)   SpO2 98%   BMI 35.14 kg/m?  ? ? ?Review of Systems  ?Constitutional:  Positive for chills. Negative for fatigue and fever.  ?HENT:  Negative for congestion, mouth sores and postnasal drip.   ?Respiratory:  Negative for cough.   ?Cardiovascular:  Negative for chest pain.  ?Gastrointestinal:  Positive for abdominal pain.  ?Genitourinary:  Positive for dysuria, frequency and urgency. Negative for flank pain.  ?Musculoskeletal:  Positive for back pain.  ?Neurological:  Positive for headaches.  ?Psychiatric/Behavioral: Negative.    ? ?Physical Exam ?Vitals and nursing note reviewed.  ?Constitutional:   ?   General: She is not in acute distress. ?   Appearance: She is well-developed. She is obese. She is not diaphoretic.  ?HENT:  ?   Head: Normocephalic and atraumatic.  ?   Mouth/Throat:  ?   Pharynx: No oropharyngeal exudate.  ?Eyes:  ?   Pupils: Pupils are equal, round, and reactive to light.  ?Neck:  ?   Thyroid: No  thyromegaly.  ?   Vascular: No JVD.  ?   Trachea: No tracheal deviation.  ?Cardiovascular:  ?   Rate and Rhythm: Normal rate and regular rhythm.  ?   Heart sounds: Normal heart sounds. No murmur heard. ?  No friction rub. No gallop.  ?Pulmonary:  ?   Effort: Pulmonary effort is normal. No respiratory distress.  ?   Breath sounds: No wheezing or rales.  ?Chest:  ?   Chest wall: No tenderness.  ?Breasts: ?   Right: Normal. No mass.  ?   Left: Normal. No mass.  ?Abdominal:  ?   General: Bowel sounds are normal.  ?   Palpations: Abdomen is soft.  ?   Tenderness: There is no abdominal tenderness.  ?Musculoskeletal:     ?   General: Normal range of motion.  ?   Cervical back: Normal  range of motion and neck supple.  ?   Right lower leg: No edema.  ?   Left lower leg: No edema.  ?Lymphadenopathy:  ?   Cervical: No cervical adenopathy.  ?Skin: ?   General: Skin is warm and dry.  ?   Findings: No erythema, lesion or rash.  ?Neurological:  ?   Mental Status: She is alert and oriented to person, place, and time.  ?   Cranial Nerves: No cranial nerve deficit.  ?Psychiatric:     ?   Behavior: Behavior normal.     ?   Thought Content: Thought content normal.     ?   Judgment: Judgment normal.  ? ? ? ? ?Assessment/Plan: ?1. Dysuria ?- POCT Urinalysis Dipstick does show +Leukocytes, will go ahead and start on ABX and adjust based on C/S. Unlikely for tick borne illness given no ticks seen, no bite marks, no rashes present, but will start on Doxycycline just in case since last UTI sensitive to doxy. Patient will call office if any new symptoms arise. ?- CULTURE, URINE COMPREHENSIVE ?- doxycycline (VIBRA-TABS) 100 MG tablet; Take 1 tablet (100 mg total) by mouth 2 (two) times daily.  Dispense: 20 tablet; Refill: 0 ? ?2. Insomnia, unspecified type ?Will start on 1/2 to 1 tab of trazodone at bedtime to help with worsening sleep. ?- traZODone (DESYREL) 50 MG tablet; Take 1 tablet (50 mg total) by mouth at bedtime.  Dispense: 30 tablet; Refill: 2 ? ? ?General Counseling: Lauren Marsh understanding of the findings of todays visit and agrees with plan of treatment. I have discussed any further diagnostic evaluation that may be needed or ordered today. We also reviewed her medications today. she has been encouraged to call the office with any questions or concerns that should arise related to todays visit. ? ? ? ?Counseling: ? ? ? ?Orders Placed This Encounter  ?Procedures  ? CULTURE, URINE COMPREHENSIVE  ? POCT Urinalysis Dipstick  ? ? ?Meds ordered this encounter  ?Medications  ? doxycycline (VIBRA-TABS) 100 MG tablet  ?  Sig: Take 1 tablet (100 mg total) by mouth 2 (two) times daily.  ?  Dispense:  20  tablet  ?  Refill:  0  ? traZODone (DESYREL) 50 MG tablet  ?  Sig: Take 1 tablet (50 mg total) by mouth at bedtime.  ?  Dispense:  30 tablet  ?  Refill:  2  ? ? ?Time spent:30 Minutes ?

## 2021-05-09 LAB — CULTURE, URINE COMPREHENSIVE

## 2021-05-28 ENCOUNTER — Other Ambulatory Visit: Payer: Self-pay | Admitting: Physician Assistant

## 2021-05-28 DIAGNOSIS — G4700 Insomnia, unspecified: Secondary | ICD-10-CM

## 2021-07-31 ENCOUNTER — Other Ambulatory Visit: Payer: Self-pay | Admitting: Physician Assistant

## 2021-07-31 DIAGNOSIS — K219 Gastro-esophageal reflux disease without esophagitis: Secondary | ICD-10-CM

## 2021-08-14 ENCOUNTER — Other Ambulatory Visit: Payer: Self-pay | Admitting: Physician Assistant

## 2021-08-14 DIAGNOSIS — I10 Essential (primary) hypertension: Secondary | ICD-10-CM

## 2021-08-23 ENCOUNTER — Ambulatory Visit: Payer: BC Managed Care – PPO | Admitting: Physician Assistant

## 2021-08-23 ENCOUNTER — Encounter: Payer: Self-pay | Admitting: Physician Assistant

## 2021-08-23 VITALS — BP 132/64 | HR 73 | Temp 98.2°F | Resp 16 | Ht 59.0 in | Wt 178.4 lb

## 2021-08-23 DIAGNOSIS — K5900 Constipation, unspecified: Secondary | ICD-10-CM | POA: Diagnosis not present

## 2021-08-23 DIAGNOSIS — I10 Essential (primary) hypertension: Secondary | ICD-10-CM | POA: Diagnosis not present

## 2021-08-23 DIAGNOSIS — G4700 Insomnia, unspecified: Secondary | ICD-10-CM | POA: Diagnosis not present

## 2021-08-23 DIAGNOSIS — R300 Dysuria: Secondary | ICD-10-CM | POA: Diagnosis not present

## 2021-08-23 LAB — POCT URINALYSIS DIPSTICK
Glucose, UA: NEGATIVE
Nitrite, UA: NEGATIVE
Protein, UA: NEGATIVE
Spec Grav, UA: 1.015 (ref 1.010–1.025)
Urobilinogen, UA: 0.2 E.U./dL
pH, UA: 7 (ref 5.0–8.0)

## 2021-08-23 MED ORDER — NITROFURANTOIN MONOHYD MACRO 100 MG PO CAPS: ORAL_CAPSULE | ORAL | 0 refills | Status: DC

## 2021-08-23 NOTE — Progress Notes (Signed)
[facility] [address] [place], [place] [address]  Internal MEDICINE  Office Visit Note  Patient Name: Lauren Marsh  [id number]  [id number]  Date of Service: 08/23/2021  Chief Complaint  Patient presents with   Follow-up   Gastroesophageal Reflux   Hypertension   Urinary Tract Infection    Frequency, urgency, feels uncomfortable, itchy    HPI Pt is here for routine follow up -Has some urinary frequency, urgency, and discomfort. This started in the past 2 weeks.   -Did finish cycle Sunday before last and reports her cycle was a day late starting and ending. -Sleeping well with trazodone, doesn't take it everynight. Takes it if stressed and restless. -BP not really checked at home but stable in office and is taking her medication as prescribed -Takes Linzess every other day and this seems to work well for her. Every day was too much, but the lower dose didn't seem effective when she saw GI previously therefore will continue with current dosing  Current Medication: Outpatient Encounter Medications as of 08/23/2021  Medication Sig   acetaminophen (TYLENOL) 500 MG tablet Take by mouth.   amLODipine (NORVASC) 10 MG tablet TAKE 1 TABLET BY MOUTH EVERY DAY   linaclotide (LINZESS) 145 MCG CAPS capsule TAKE 1 CAPSULE BY MOUTH EVERY DAY BEFORE BREAKFAST   LINZESS 145 MCG CAPS capsule TAKE 1 CAPSULE BY MOUTH EVERY DAY BEFORE BREAKFAST   nitrofurantoin, macrocrystal-monohydrate, (MACROBID) 100 MG capsule Take 1 cap twice per day for 10 days.   pantoprazole (PROTONIX) 40 MG tablet TAKE 1 TABLET BY MOUTH EVERY DAY IN THE MORNING   rosuvastatin (CRESTOR) 5 MG tablet Take 1 tablet by mouth at night twice per week   traZODone (DESYREL) 50 MG tablet TAKE 1 TABLET BY MOUTH EVERYDAY AT BEDTIME   TRI-LO-MARZIA 0.18/0.215/0.25 MG-25 MCG tab Use as directed for 28 days   [DISCONTINUED] cyclobenzaprine (FLEXERIL) 10 MG tablet Take 1 tablet (10 mg total) by mouth at bedtime. Take one  tab po qhs for back spasm prn only   [DISCONTINUED] doxycycline (VIBRA-TABS) 100 MG tablet Take 1 tablet (100 mg total) by mouth 2 (two) times daily.   No facility-administered encounter medications on file as of 08/23/2021.    Surgical History: Past Surgical History:  Procedure Laterality Date   ESOPHAGOGASTRODUODENOSCOPY (EGD) WITH PROPOFOL N/A 07/24/2020   Procedure: ESOPHAGOGASTRODUODENOSCOPY (EGD) WITH PROPOFOL;  Surgeon: Toney Reil, MD;  Location: [facility] ENDOSCOPY;  Service: Gastroenterology;  Laterality: N/A;   EYE SURGERY      Medical History: Past Medical History:  Diagnosis Date   GERD (gastroesophageal reflux disease)    Hypertension     Family History: Family History  Problem Relation Age of Onset   Hypertension Mother    Hypertension Father     Social History   Socioeconomic History   Marital status: Single    Spouse name: Not on file   Number of children: Not on file   Years of education: Not on file   Highest education level: Not on file  Occupational History   Not on file  Tobacco Use   Smoking status: Never   Smokeless tobacco: Never  Vaping Use   Vaping Use: Never used  Substance and Sexual Activity   Alcohol use: Yes    Alcohol/week: 1.0 standard drink of alcohol    Types: 1 Cans of beer per week    Comment: occ.   Drug use: Never   Sexual activity: Not on file  Other Topics  Concern   Not on file  Social History Narrative   Not on file   Social Determinants of Health   Financial Resource Strain: Not on file  Food Insecurity: Not on file  Transportation Needs: Not on file  Physical Activity: Not on file  Stress: Not on file  Social Connections: Not on file  Intimate Partner Violence: Not on file      Review of Systems  Constitutional:  Negative for chills, fatigue and unexpected weight change.  HENT:  Negative for congestion, postnasal drip, rhinorrhea, sneezing and sore throat.   Eyes:  Negative for redness.  Respiratory:   Negative for cough, chest tightness and shortness of breath.   Cardiovascular:  Negative for chest pain and palpitations.  Gastrointestinal:  Negative for abdominal pain, constipation, diarrhea, nausea and vomiting.  Genitourinary:  Positive for dysuria, frequency and urgency. Negative for flank pain.  Musculoskeletal:  Negative for arthralgias, back pain, joint swelling and neck pain.  Skin:  Negative for rash.  Neurological: Negative.  Negative for tremors and numbness.  Hematological:  Negative for adenopathy. Does not bruise/bleed easily.  Psychiatric/Behavioral:  Positive for sleep disturbance. Negative for behavioral problems (Depression) and suicidal ideas. The patient is not nervous/anxious.     Vital Signs: BP 132/64   Pulse 73   Temp 98.2 F (36.8 C)   Resp 16   Ht 4\' 11"  (1.499 m)   Wt 178 lb 6.4 oz (80.9 kg)   SpO2 99%   BMI 36.03 kg/m    Physical Exam Vitals and nursing note reviewed.  Constitutional:      General: She is not in acute distress.    Appearance: She is well-developed. She is obese. She is not diaphoretic.  HENT:     Head: Normocephalic and atraumatic.     Mouth/Throat:     Pharynx: No oropharyngeal exudate.  Eyes:     Pupils: Pupils are equal, round, and reactive to light.  Neck:     Thyroid: No thyromegaly.     Vascular: No JVD.     Trachea: No tracheal deviation.  Cardiovascular:     Rate and Rhythm: Normal rate and regular rhythm.     Heart sounds: Normal heart sounds. No murmur heard.    No friction rub. No gallop.  Pulmonary:     Effort: Pulmonary effort is normal. No respiratory distress.     Breath sounds: No wheezing or rales.  Chest:     Chest wall: No tenderness.  Breasts:    Right: Normal. No mass.     Left: Normal. No mass.  Abdominal:     General: Bowel sounds are normal.     Palpations: Abdomen is soft.     Tenderness: There is no abdominal tenderness. There is no right CVA tenderness or left CVA tenderness.   Musculoskeletal:        General: Normal range of motion.     Cervical back: Normal range of motion and neck supple.     Right lower leg: No edema.     Left lower leg: No edema.  Lymphadenopathy:     Cervical: No cervical adenopathy.  Skin:    General: Skin is warm and dry.     Findings: No erythema, lesion or rash.  Neurological:     Mental Status: She is alert and oriented to person, place, and time.     Cranial Nerves: No cranial nerve deficit.  Psychiatric:        Behavior: Behavior normal.  Thought Content: Thought content normal.        Judgment: Judgment normal.        Assessment/Plan: 1. Essential hypertension Stable, continue current medications  2. Insomnia, unspecified type Improved, may continue trazodone  3. Constipation, unspecified constipation type Improved, may continue linzess as before  4. Dysuria Will start on macrobid BID and adjust based on C/S - POCT Urinalysis Dipstick - CULTURE, URINE COMPREHENSIVE - nitrofurantoin, macrocrystal-monohydrate, (MACROBID) 100 MG capsule; Take 1 cap twice per day for 10 days.  Dispense: 20 capsule; Refill: 0   General Counseling: kathaleya mcduffee understanding of the findings of todays visit and agrees with plan of treatment. I have discussed any further diagnostic evaluation that may be needed or ordered today. We also reviewed her medications today. she has been encouraged to call the office with any questions or concerns that should arise related to todays visit.    Orders Placed This Encounter  Procedures   CULTURE, URINE COMPREHENSIVE   POCT Urinalysis Dipstick    Meds ordered this encounter  Medications   nitrofurantoin, macrocrystal-monohydrate, (MACROBID) 100 MG capsule    Sig: Take 1 cap twice per day for 10 days.    Dispense:  20 capsule    Refill:  0    This patient was seen by Lynn Ito, PA-C in collaboration with Dr. Beverely Risen as a part of collaborative care  agreement.   Total time spent:30 Minutes Time spent includes review of chart, medications, test results, and follow up plan with the patient.      Dr Lyndon Code Internal medicine

## 2021-08-26 LAB — CULTURE, URINE COMPREHENSIVE

## 2021-09-29 ENCOUNTER — Other Ambulatory Visit: Payer: Self-pay | Admitting: Physician Assistant

## 2021-09-29 DIAGNOSIS — Z3041 Encounter for surveillance of contraceptive pills: Secondary | ICD-10-CM

## 2021-12-27 ENCOUNTER — Ambulatory Visit: Payer: BC Managed Care – PPO | Admitting: Physician Assistant

## 2022-02-13 ENCOUNTER — Other Ambulatory Visit: Payer: Self-pay | Admitting: Physician Assistant

## 2022-02-13 DIAGNOSIS — K219 Gastro-esophageal reflux disease without esophagitis: Secondary | ICD-10-CM

## 2022-02-13 DIAGNOSIS — I10 Essential (primary) hypertension: Secondary | ICD-10-CM

## 2022-02-20 ENCOUNTER — Encounter: Payer: Self-pay | Admitting: Physician Assistant

## 2022-02-20 ENCOUNTER — Ambulatory Visit: Payer: BC Managed Care – PPO | Admitting: Physician Assistant

## 2022-02-20 VITALS — BP 144/80 | HR 73 | Temp 98.4°F | Resp 16 | Ht 59.0 in | Wt 185.6 lb

## 2022-02-20 DIAGNOSIS — R252 Cramp and spasm: Secondary | ICD-10-CM

## 2022-02-20 DIAGNOSIS — R300 Dysuria: Secondary | ICD-10-CM

## 2022-02-20 DIAGNOSIS — E782 Mixed hyperlipidemia: Secondary | ICD-10-CM | POA: Diagnosis not present

## 2022-02-20 DIAGNOSIS — R946 Abnormal results of thyroid function studies: Secondary | ICD-10-CM

## 2022-02-20 DIAGNOSIS — R5383 Other fatigue: Secondary | ICD-10-CM

## 2022-02-20 DIAGNOSIS — I10 Essential (primary) hypertension: Secondary | ICD-10-CM | POA: Diagnosis not present

## 2022-02-20 NOTE — Addendum Note (Signed)
Addended by: Tyron Russell on: 02/20/2022 04:16 PM   Modules accepted: Orders

## 2022-02-20 NOTE — Progress Notes (Signed)
[facility] [address] [place], [place] [address]  Internal MEDICINE  Office Visit Note  Patient Name: Lauren Marsh  [id number]  [id number]  Date of Service: 02/20/2022  Chief Complaint  Patient presents with   Follow-up   Gastroesophageal Reflux   Hypertension    HPI Pt is here for routine follow up -Doing well. Has new grandbaby -Getting cramps in calves intermittently during sleep. States it switches between which side depending on which side she sleeps on. taking crestor 2x/week when she remembers therefore doesn't attribute cramps to this. Does state she might get a new mattress as she does get uncomfortable too. Will check labs prior to CPE to make sure potassium/magnesium are ok. Also advised to drink plenty of fluids as dehydration can lead to cramps -Stopped linzess because she couldn't leave the house after. Started only taking it if she couldn't go and still caused this so stopped completely. Constipation has been better. Occasionally still some constipation. May try supplement like metamucil. If worsening again could try lower dose linzess -has not been check BP recently but had been good. Will start monitoring again since borderline elevated in office today.  Current Medication: Outpatient Encounter Medications as of 02/20/2022  Medication Sig   acetaminophen (TYLENOL) 500 MG tablet Take by mouth.   amLODipine (NORVASC) 10 MG tablet TAKE 1 TABLET BY MOUTH EVERY DAY   nitrofurantoin, macrocrystal-monohydrate, (MACROBID) 100 MG capsule Take 1 cap twice per day for 10 days.   pantoprazole (PROTONIX) 40 MG tablet TAKE 1 TABLET BY MOUTH EVERY DAY IN THE MORNING   rosuvastatin (CRESTOR) 5 MG tablet Take 1 tablet by mouth at night twice per week   traZODone (DESYREL) 50 MG tablet TAKE 1 TABLET BY MOUTH EVERYDAY AT BEDTIME   TRI-LO-MARZIA 0.18/0.215/0.25 MG-25 MCG tab USE AS DIRECTED FOR 28 DAYS   [DISCONTINUED] linaclotide (LINZESS) 145 MCG CAPS capsule  TAKE 1 CAPSULE BY MOUTH EVERY DAY BEFORE BREAKFAST   [DISCONTINUED] LINZESS 145 MCG CAPS capsule TAKE 1 CAPSULE BY MOUTH EVERY DAY BEFORE BREAKFAST   No facility-administered encounter medications on file as of 02/20/2022.    Surgical History: Past Surgical History:  Procedure Laterality Date   ESOPHAGOGASTRODUODENOSCOPY (EGD) WITH PROPOFOL N/A 07/24/2020   Procedure: ESOPHAGOGASTRODUODENOSCOPY (EGD) WITH PROPOFOL;  Surgeon: Toney Reil, MD;  Location: [facility] ENDOSCOPY;  Service: Gastroenterology;  Laterality: N/A;   EYE SURGERY      Medical History: Past Medical History:  Diagnosis Date   GERD (gastroesophageal reflux disease)    Hypertension     Family History: Family History  Problem Relation Age of Onset   Hypertension Mother    Hypertension Father     Social History   Socioeconomic History   Marital status: Single    Spouse name: Not on file   Number of children: Not on file   Years of education: Not on file   Highest education level: Not on file  Occupational History   Not on file  Tobacco Use   Smoking status: Never   Smokeless tobacco: Never  Vaping Use   Vaping Use: Never used  Substance and Sexual Activity   Alcohol use: Yes    Alcohol/week: 1.0 standard drink of alcohol    Types: 1 Cans of beer per week    Comment: occ.   Drug use: Never   Sexual activity: Not on file  Other Topics Concern   Not on file  Social History Narrative   Not on file   Social  Determinants of Health   Financial Resource Strain: Not on file  Food Insecurity: Not on file  Transportation Needs: Not on file  Physical Activity: Not on file  Stress: Not on file  Social Connections: Not on file  Intimate Partner Violence: Not on file      Review of Systems  Constitutional:  Negative for chills, fatigue and unexpected weight change.  HENT:  Negative for congestion, postnasal drip, rhinorrhea, sneezing and sore throat.   Eyes:  Negative for redness.  Respiratory:   Negative for cough, chest tightness and shortness of breath.   Cardiovascular:  Negative for chest pain and palpitations.  Gastrointestinal:  Positive for constipation. Negative for abdominal pain, diarrhea, nausea and vomiting.  Genitourinary:  Negative for dysuria and frequency.  Musculoskeletal:  Negative for arthralgias, back pain, joint swelling and neck pain.       Muscle cramps at night  Skin:  Negative for rash.  Neurological: Negative.  Negative for tremors and numbness.  Hematological:  Negative for adenopathy. Does not bruise/bleed easily.  Psychiatric/Behavioral:  Negative for behavioral problems (Depression), sleep disturbance and suicidal ideas. The patient is not nervous/anxious.     Vital Signs: BP (!) 144/80 Comment: 146/73  Pulse 73   Temp 98.4 F (36.9 C)   Resp 16   Ht 4\' 11"  (1.499 m)   Wt 185 lb 9.6 oz (84.2 kg)   SpO2 98%   BMI 37.49 kg/m    Physical Exam Vitals and nursing note reviewed.  Constitutional:      General: She is not in acute distress.    Appearance: She is well-developed. She is obese. She is not diaphoretic.  HENT:     Head: Normocephalic and atraumatic.     Mouth/Throat:     Pharynx: No oropharyngeal exudate.  Eyes:     Pupils: Pupils are equal, round, and reactive to light.  Neck:     Thyroid: No thyromegaly.     Vascular: No JVD.     Trachea: No tracheal deviation.  Cardiovascular:     Rate and Rhythm: Normal rate and regular rhythm.     Heart sounds: Normal heart sounds. No murmur heard.    No friction rub. No gallop.  Pulmonary:     Effort: Pulmonary effort is normal. No respiratory distress.     Breath sounds: No wheezing or rales.  Chest:     Chest wall: No tenderness.  Breasts:    Right: Normal. No mass.     Left: Normal. No mass.  Abdominal:     General: Bowel sounds are normal.     Palpations: Abdomen is soft.  Musculoskeletal:        General: Normal range of motion.     Cervical back: Normal range of motion and  neck supple.     Right lower leg: No edema.     Left lower leg: No edema.  Lymphadenopathy:     Cervical: No cervical adenopathy.  Skin:    General: Skin is warm and dry.     Findings: No erythema, lesion or rash.  Neurological:     Mental Status: She is alert and oriented to person, place, and time.     Cranial Nerves: No cranial nerve deficit.  Psychiatric:        Behavior: Behavior normal.        Thought Content: Thought content normal.        Judgment: Judgment normal.        Assessment/Plan: 1. Essential  hypertension Borderline elevated in office and will monitor closely.  2. Muscle cramps Will check labs and advised to stay well hydrated - Comprehensive metabolic panel - Magnesium  3. Mixed hyperlipidemia - Lipid Panel With LDL/HDL Ratio  4. Abnormal thyroid exam - TSH + free T4  5. Other fatigue - CBC w/Diff/Platelet - Comprehensive metabolic panel - TSH + free T4 - Lipid Panel With LDL/HDL Ratio - Magnesium   General Counseling: Jatoria verbalizes understanding of the findings of todays visit and agrees with plan of treatment. I have discussed any further diagnostic evaluation that may be needed or ordered today. We also reviewed her medications today. she has been encouraged to call the office with any questions or concerns that should arise related to todays visit.    Orders Placed This Encounter  Procedures   CBC w/Diff/Platelet   Comprehensive metabolic panel   TSH + free T4   Lipid Panel With LDL/HDL Ratio   Magnesium    No orders of the defined types were placed in this encounter.   This patient was seen by Drema Dallas, PA-C in collaboration with Dr. Clayborn Bigness as a part of collaborative care agreement.   Total time spent:30 Minutes Time spent includes review of chart, medications, test results, and follow up plan with the patient.      Dr Lavera Guise Internal medicine

## 2022-02-21 LAB — UA/M W/RFLX CULTURE, ROUTINE
Bilirubin, UA: NEGATIVE
Glucose, UA: NEGATIVE
Ketones, UA: NEGATIVE
Leukocytes,UA: NEGATIVE
Nitrite, UA: NEGATIVE
Protein,UA: NEGATIVE
Specific Gravity, UA: 1.006 (ref 1.005–1.030)
Urobilinogen, Ur: 0.2 mg/dL (ref 0.2–1.0)
pH, UA: 7.5 (ref 5.0–7.5)

## 2022-02-21 LAB — MICROSCOPIC EXAMINATION
Bacteria, UA: NONE SEEN
Casts: NONE SEEN /lpf
RBC, Urine: NONE SEEN /hpf (ref 0–2)
WBC, UA: NONE SEEN /hpf (ref 0–5)

## 2022-03-20 ENCOUNTER — Other Ambulatory Visit: Payer: Self-pay | Admitting: Physician Assistant

## 2022-03-20 DIAGNOSIS — Z3041 Encounter for surveillance of contraceptive pills: Secondary | ICD-10-CM

## 2022-04-17 DIAGNOSIS — R5383 Other fatigue: Secondary | ICD-10-CM | POA: Diagnosis not present

## 2022-04-17 DIAGNOSIS — R946 Abnormal results of thyroid function studies: Secondary | ICD-10-CM | POA: Diagnosis not present

## 2022-04-17 DIAGNOSIS — R252 Cramp and spasm: Secondary | ICD-10-CM | POA: Diagnosis not present

## 2022-04-17 DIAGNOSIS — E782 Mixed hyperlipidemia: Secondary | ICD-10-CM | POA: Diagnosis not present

## 2022-04-18 LAB — CBC WITH DIFFERENTIAL/PLATELET
Basophils Absolute: 0 10*3/uL (ref 0.0–0.2)
Basos: 1 %
EOS (ABSOLUTE): 0.4 10*3/uL (ref 0.0–0.4)
Eos: 6 %
Hematocrit: 39.3 % (ref 34.0–46.6)
Hemoglobin: 13 g/dL (ref 11.1–15.9)
Immature Grans (Abs): 0 10*3/uL (ref 0.0–0.1)
Immature Granulocytes: 0 %
Lymphocytes Absolute: 1.6 10*3/uL (ref 0.7–3.1)
Lymphs: 24 %
MCH: 29.9 pg (ref 26.6–33.0)
MCHC: 33.1 g/dL (ref 31.5–35.7)
MCV: 90 fL (ref 79–97)
Monocytes Absolute: 0.4 10*3/uL (ref 0.1–0.9)
Monocytes: 7 %
Neutrophils Absolute: 4.1 10*3/uL (ref 1.4–7.0)
Neutrophils: 62 %
Platelets: 326 10*3/uL (ref 150–450)
RBC: 4.35 x10E6/uL (ref 3.77–5.28)
RDW: 12.1 % (ref 11.7–15.4)
WBC: 6.6 10*3/uL (ref 3.4–10.8)

## 2022-04-18 LAB — COMPREHENSIVE METABOLIC PANEL
ALT: 15 IU/L (ref 0–32)
AST: 17 IU/L (ref 0–40)
Albumin/Globulin Ratio: 1.8 (ref 1.2–2.2)
Albumin: 4 g/dL (ref 3.8–4.9)
Alkaline Phosphatase: 52 IU/L (ref 44–121)
BUN/Creatinine Ratio: 12 (ref 9–23)
BUN: 9 mg/dL (ref 6–24)
Bilirubin Total: 0.3 mg/dL (ref 0.0–1.2)
CO2: 22 mmol/L (ref 20–29)
Calcium: 9 mg/dL (ref 8.7–10.2)
Chloride: 104 mmol/L (ref 96–106)
Creatinine, Ser: 0.75 mg/dL (ref 0.57–1.00)
Globulin, Total: 2.2 g/dL (ref 1.5–4.5)
Glucose: 110 mg/dL — ABNORMAL HIGH (ref 70–99)
Potassium: 4.4 mmol/L (ref 3.5–5.2)
Sodium: 138 mmol/L (ref 134–144)
Total Protein: 6.2 g/dL (ref 6.0–8.5)
eGFR: 92 mL/min/{1.73_m2} (ref >59)

## 2022-04-18 LAB — TSH+FREE T4
Free T4: 0.99 ng/dL (ref 0.82–1.77)
TSH: 2.79 u[IU]/mL (ref 0.450–4.500)

## 2022-04-18 LAB — LIPID PANEL WITH LDL/HDL RATIO
Cholesterol, Total: 189 mg/dL (ref 100–199)
HDL: 74 mg/dL (ref >39)
LDL Chol Calc (NIH): 97 mg/dL (ref 0–99)
LDL/HDL Ratio: 1.3 ratio (ref 0.0–3.2)
Triglycerides: 101 mg/dL (ref 0–149)
VLDL Cholesterol Cal: 18 mg/dL (ref 5–40)

## 2022-04-18 LAB — MAGNESIUM: Magnesium: 1.9 mg/dL (ref 1.6–2.3)

## 2022-04-25 ENCOUNTER — Encounter: Payer: BC Managed Care – PPO | Admitting: Physician Assistant

## 2022-04-28 ENCOUNTER — Ambulatory Visit (INDEPENDENT_AMBULATORY_CARE_PROVIDER_SITE_OTHER): Payer: BC Managed Care – PPO | Admitting: Physician Assistant

## 2022-04-28 ENCOUNTER — Encounter: Payer: Self-pay | Admitting: Physician Assistant

## 2022-04-28 VITALS — BP 124/69 | HR 80 | Temp 98.1°F | Resp 16 | Ht 59.0 in | Wt 186.2 lb

## 2022-04-28 DIAGNOSIS — R7309 Other abnormal glucose: Secondary | ICD-10-CM

## 2022-04-28 DIAGNOSIS — Z0001 Encounter for general adult medical examination with abnormal findings: Secondary | ICD-10-CM | POA: Diagnosis not present

## 2022-04-28 DIAGNOSIS — E782 Mixed hyperlipidemia: Secondary | ICD-10-CM

## 2022-04-28 DIAGNOSIS — I10 Essential (primary) hypertension: Secondary | ICD-10-CM | POA: Diagnosis not present

## 2022-04-28 DIAGNOSIS — Z1231 Encounter for screening mammogram for malignant neoplasm of breast: Secondary | ICD-10-CM | POA: Diagnosis not present

## 2022-04-28 LAB — POCT GLYCOSYLATED HEMOGLOBIN (HGB A1C): Hemoglobin A1C: 5.2 % (ref 4.0–5.6)

## 2022-04-28 NOTE — Progress Notes (Signed)
[facility] [place], [place] [address]  Internal MEDICINE  Office Visit Note  Patient Name: Lauren Marsh  [id number]  [id number]  Date of Service: 04/28/2022  Chief Complaint  Patient presents with   Annual Exam   Gastroesophageal Reflux   Hypertension   Quality Metric Gaps    Mammogram     HPI Pt is here for routine health maintenance examination -Labs reviewed: overall look good, glucose up a little, lipids improving. Will check A1c in office -Does continue to require protonix for reflux, she missed a day and drank some beer and ate food and had bad heart burn that night -She has otherwise been eating better, cooking at home more and eating leftovers. Will start working on exercise now -Bp stable, not checking at home recently -She goes to [facility] for [employer] fax order as she is due now -Sleeping better overall, will sometimes wake up at night but able to go right back to sleep. Does occasionally use trazodone, but not nightly -Did see eye doctor recently for new reading glasses, does not some eye watering recently with weather change as well as looking at 3 screens for work. She will follow up with eye doctor if this continues to be a problem  Current Medication: Outpatient Encounter Medications as of 04/28/2022  Medication Sig   acetaminophen (TYLENOL) 500 MG tablet Take by mouth.   amLODipine (NORVASC) 10 MG tablet TAKE 1 TABLET BY MOUTH EVERY DAY   pantoprazole (PROTONIX) 40 MG tablet TAKE 1 TABLET BY MOUTH EVERY DAY IN THE MORNING   rosuvastatin (CRESTOR) 5 MG tablet Take 1 tablet by mouth at night twice per week   traZODone (DESYREL) 50 MG tablet TAKE 1 TABLET BY MOUTH EVERYDAY AT BEDTIME   TRI-LO-MARZIA 0.18/0.215/0.25 MG-25 MCG tab USE AS DIRECTED FOR 28 DAYS   [DISCONTINUED] nitrofurantoin, macrocrystal-monohydrate, (MACROBID) 100 MG capsule Take 1 cap twice per day for 10 days.   No facility-administered encounter medications  on file as of 04/28/2022.    Surgical History: Past Surgical History:  Procedure Laterality Date   ESOPHAGOGASTRODUODENOSCOPY (EGD) WITH PROPOFOL N/A 07/24/2020   Procedure: ESOPHAGOGASTRODUODENOSCOPY (EGD) WITH PROPOFOL;  Surgeon: Lin Landsman, MD;  Location: [facility] ENDOSCOPY;  Service: Gastroenterology;  Laterality: N/A;   EYE SURGERY      Medical History: Past Medical History:  Diagnosis Date   GERD (gastroesophageal reflux disease)    Hypertension     Family History: Family History  Problem Relation Age of Onset   Hypertension Mother    Hypertension Father       Review of Systems  Constitutional:  Negative for chills, fatigue and unexpected weight change.  HENT:  Positive for postnasal drip. Negative for congestion, rhinorrhea, sneezing and sore throat.   Eyes:  Negative for redness.  Respiratory:  Negative for cough, chest tightness and shortness of breath.   Cardiovascular:  Negative for chest pain and palpitations.  Gastrointestinal:  Negative for abdominal pain, constipation, diarrhea, nausea and vomiting.  Genitourinary:  Negative for dysuria and frequency.  Musculoskeletal:  Negative for arthralgias, back pain, joint swelling and neck pain.  Skin:  Negative for rash.  Neurological: Negative.  Negative for tremors and numbness.  Hematological:  Negative for adenopathy. Does not bruise/bleed easily.  Psychiatric/Behavioral:  Negative for behavioral problems (Depression), sleep disturbance and suicidal ideas. The patient is not nervous/anxious.      Vital Signs: BP 124/69   Pulse 80   Temp 98.1 F (36.7 C)  Resp 16   Ht 4\' 11"  (1.499 m)   Wt 186 lb 3.2 oz (84.5 kg)   SpO2 99%   BMI 37.61 kg/m    Physical Exam Vitals and nursing note reviewed.  Constitutional:      General: She is not in acute distress.    Appearance: She is well-developed. She is obese. She is not diaphoretic.  HENT:     Head: Normocephalic and atraumatic.     Mouth/Throat:      Pharynx: No oropharyngeal exudate.  Eyes:     Pupils: Pupils are equal, round, and reactive to light.  Neck:     Thyroid: No thyromegaly.     Vascular: No JVD.     Trachea: No tracheal deviation.  Cardiovascular:     Rate and Rhythm: Normal rate and regular rhythm.     Heart sounds: Normal heart sounds. No murmur heard.    No friction rub. No gallop.  Pulmonary:     Effort: Pulmonary effort is normal. No respiratory distress.     Breath sounds: No wheezing or rales.  Chest:     Chest wall: No tenderness.  Breasts:    Right: Normal. No mass.     Left: Normal. No mass.  Abdominal:     General: Bowel sounds are normal.     Palpations: Abdomen is soft.  Musculoskeletal:        General: Normal range of motion.     Cervical back: Normal range of motion and neck supple.     Right lower leg: No edema.     Left lower leg: No edema.  Lymphadenopathy:     Cervical: No cervical adenopathy.  Skin:    General: Skin is warm and dry.     Findings: No erythema, lesion or rash.  Neurological:     Mental Status: She is alert and oriented to person, place, and time.     Cranial Nerves: No cranial nerve deficit.  Psychiatric:        Behavior: Behavior normal.        Thought Content: Thought content normal.        Judgment: Judgment normal.      LABS: Recent Results (from the past 2160 hour(s))  UA/M w/rflx Culture, Routine     Status: Abnormal   Collection Time: 02/20/22  4:16 PM   Specimen: Urine   Urine  Result Value Ref Range   Specific Gravity, UA 1.006 1.005 - 1.030   pH, UA 7.5 5.0 - 7.5   Color, UA Yellow Yellow   Appearance Ur Clear Clear   Leukocytes,UA Negative Negative   Protein,UA Negative Negative/Trace   Glucose, UA Negative Negative   Ketones, UA Negative Negative   RBC, UA 2+ (A) Negative   Bilirubin, UA Negative Negative   Urobilinogen, Ur 0.2 0.2 - 1.0 mg/dL   Nitrite, UA Negative Negative   Microscopic Examination See below:     Comment: Microscopic was  indicated and was performed.   Urinalysis Reflex Comment     Comment: This specimen will not reflex to a Urine Culture.  Microscopic Examination     Status: None   Collection Time: 02/20/22  4:16 PM   Urine  Result Value Ref Range   WBC, UA None seen 0 - 5 /hpf   RBC, Urine None seen 0 - 2 /hpf   Epithelial Cells (non renal) 0-10 0 - 10 /hpf   Casts None seen None seen /lpf   Bacteria, UA None seen  None seen/Few  CBC w/Diff/Platelet     Status: None   Collection Time: 04/17/22  7:07 AM  Result Value Ref Range   WBC 6.6 3.4 - 10.8 x10E3/uL   RBC 4.35 3.77 - 5.28 x10E6/uL   Hemoglobin 13.0 11.1 - 15.9 g/dL   Hematocrit 39.3 34.0 - 46.6 %   MCV 90 79 - 97 fL   MCH 29.9 26.6 - 33.0 pg   MCHC 33.1 31.5 - 35.7 g/dL   RDW 12.1 11.7 - 15.4 %   Platelets 326 150 - 450 x10E3/uL   Neutrophils 62 Not Estab. %   Lymphs 24 Not Estab. %   Monocytes 7 Not Estab. %   Eos 6 Not Estab. %   Basos 1 Not Estab. %   Neutrophils Absolute 4.1 1.4 - 7.0 x10E3/uL   Lymphocytes Absolute 1.6 0.7 - 3.1 x10E3/uL   Monocytes Absolute 0.4 0.1 - 0.9 x10E3/uL   EOS (ABSOLUTE) 0.4 0.0 - 0.4 x10E3/uL   Basophils Absolute 0.0 0.0 - 0.2 x10E3/uL   Immature Granulocytes 0 Not Estab. %   Immature Grans (Abs) 0.0 0.0 - 0.1 x10E3/uL  Comprehensive metabolic panel     Status: Abnormal   Collection Time: 04/17/22  7:07 AM  Result Value Ref Range   Glucose 110 (H) 70 - 99 mg/dL   BUN 9 6 - 24 mg/dL   Creatinine, Ser 0.75 0.57 - 1.00 mg/dL   eGFR 92 >59 mL/min/1.73   BUN/Creatinine Ratio 12 9 - 23   Sodium 138 134 - 144 mmol/L   Potassium 4.4 3.5 - 5.2 mmol/L   Chloride 104 96 - 106 mmol/L   CO2 22 20 - 29 mmol/L   Calcium 9.0 8.7 - 10.2 mg/dL   Total Protein 6.2 6.0 - 8.5 g/dL   Albumin 4.0 3.8 - 4.9 g/dL   Globulin, Total 2.2 1.5 - 4.5 g/dL   Albumin/Globulin Ratio 1.8 1.2 - 2.2   Bilirubin Total 0.3 0.0 - 1.2 mg/dL   Alkaline Phosphatase 52 44 - 121 IU/L   AST 17 0 - 40 IU/L   ALT 15 0 - 32 IU/L  TSH +  free T4     Status: None   Collection Time: 04/17/22  7:07 AM  Result Value Ref Range   TSH 2.790 0.450 - 4.500 uIU/mL   Free T4 0.99 0.82 - 1.77 ng/dL  Lipid Panel With LDL/HDL Ratio     Status: None   Collection Time: 04/17/22  7:07 AM  Result Value Ref Range   Cholesterol, Total 189 100 - 199 mg/dL   Triglycerides 101 0 - 149 mg/dL   HDL 74 >39 mg/dL   VLDL Cholesterol Cal 18 5 - 40 mg/dL   LDL Chol Calc (NIH) 97 0 - 99 mg/dL   LDL/HDL Ratio 1.3 0.0 - 3.2 ratio    Comment:                                     LDL/HDL Ratio                                             Men  Women  1/2 Avg.Risk  1.0    1.5                                   Avg.Risk  3.6    3.2                                2X Avg.Risk  6.2    5.0                                3X Avg.Risk  8.0    6.1   Magnesium     Status: None   Collection Time: 04/17/22  7:07 AM  Result Value Ref Range   Magnesium 1.9 1.6 - 2.3 mg/dL  POCT HgB A1C     Status: None   Collection Time: 04/28/22  9:38 AM  Result Value Ref Range   Hemoglobin A1C 5.2 4.0 - 5.6 %   HbA1c POC (<> result, manual entry)     HbA1c, POC (prediabetic range)     HbA1c, POC (controlled diabetic range)          Assessment/Plan: 1. Encounter for general adult medical examination with abnormal findings CPE performed, labs reviewed, due for mammogram  2. Essential hypertension Stable, continue current medication  3. Mixed hyperlipidemia Improving, continue crestor 2x/week  4. Increased glucose level - POCT HgB A1C is 5.2 which is in normal range  5. Visit for screening mammogram Will fax order to [facility] imaging and pt will call to schedule     General Counseling: ximenna buyers understanding of the findings of todays visit and agrees with plan of treatment. I have discussed any further diagnostic evaluation that may be needed or ordered today. We also reviewed her medications today. she has been encouraged to  call the office with any questions or concerns that should arise related to todays visit.    Counseling:    Orders Placed This Encounter  Procedures   POCT HgB A1C    No orders of the defined types were placed in this encounter.   This patient was seen by Drema Dallas, PA-C in collaboration with Dr. Clayborn Bigness as a part of collaborative care agreement.  Total time spent:35 Minutes  Time spent includes review of chart, medications, test results, and follow up plan with the patient.     Lavera Guise, MD  Internal Medicine

## 2022-07-05 ENCOUNTER — Other Ambulatory Visit: Payer: Self-pay | Admitting: Physician Assistant

## 2022-07-05 DIAGNOSIS — E782 Mixed hyperlipidemia: Secondary | ICD-10-CM

## 2022-08-18 ENCOUNTER — Other Ambulatory Visit: Payer: Self-pay | Admitting: Physician Assistant

## 2022-08-18 DIAGNOSIS — I10 Essential (primary) hypertension: Secondary | ICD-10-CM

## 2022-08-18 DIAGNOSIS — K219 Gastro-esophageal reflux disease without esophagitis: Secondary | ICD-10-CM

## 2022-08-22 IMAGING — US US ABDOMEN COMPLETE
1 series · 14 of 25 positions shown · non-contrast
Comparison: None.

CLINICAL DATA: Abdominal distension, abdominal pain

EXAM:
ABDOMEN ULTRASOUND COMPLETE

[Series 1: us abdomen complete · 0.22mm/px · 14 of 90 slices shown]
[im 1/90]
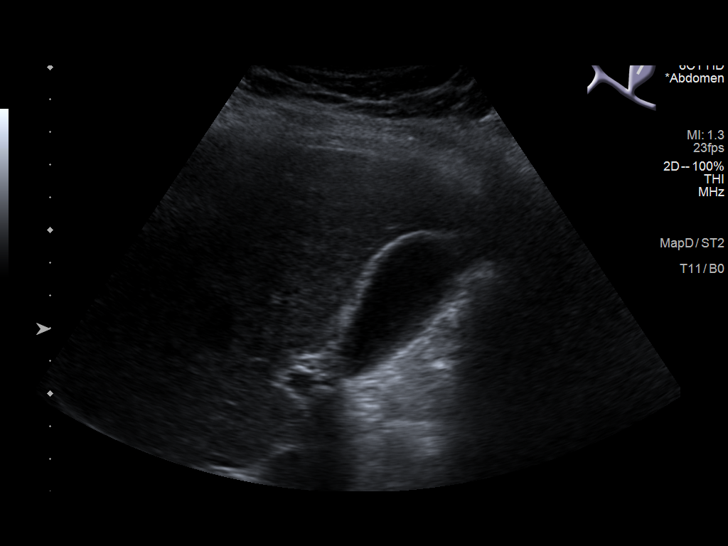
[im 8/90]
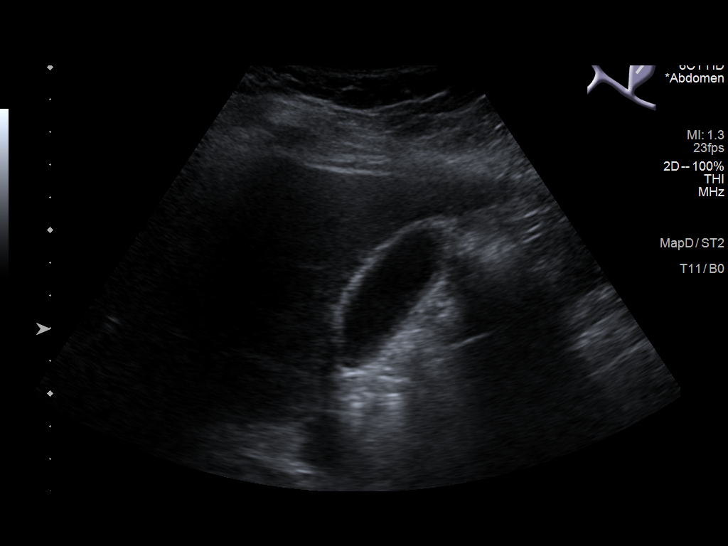
[im 15/90]
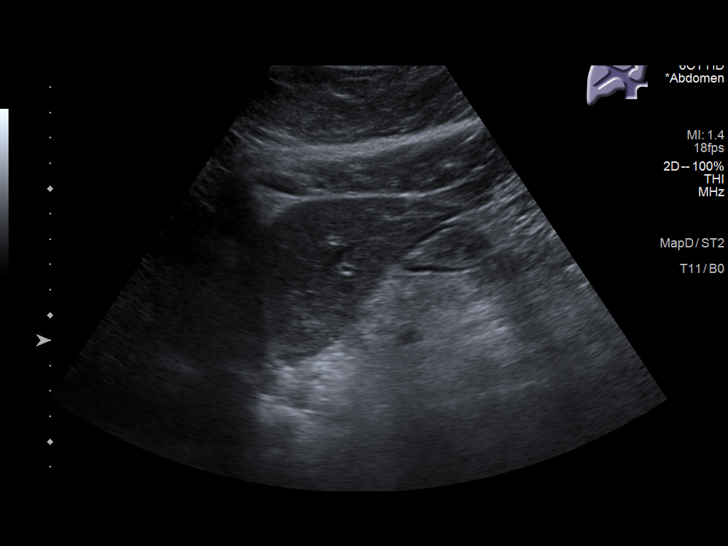
[im 23/90]
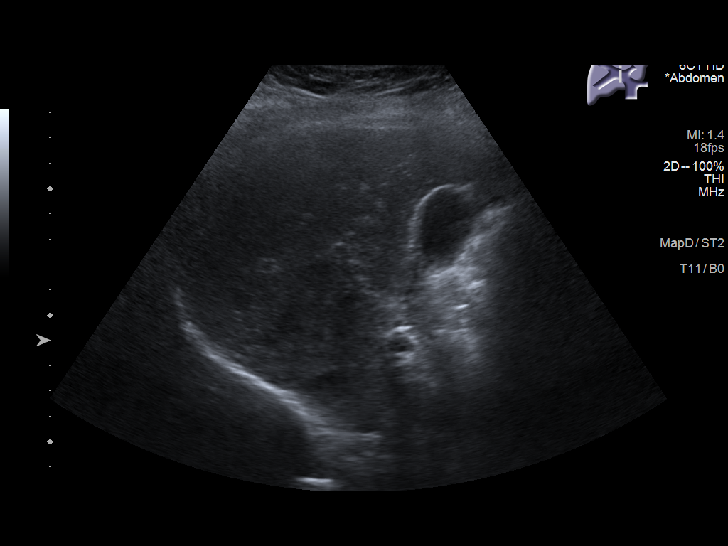
[im 30/90]
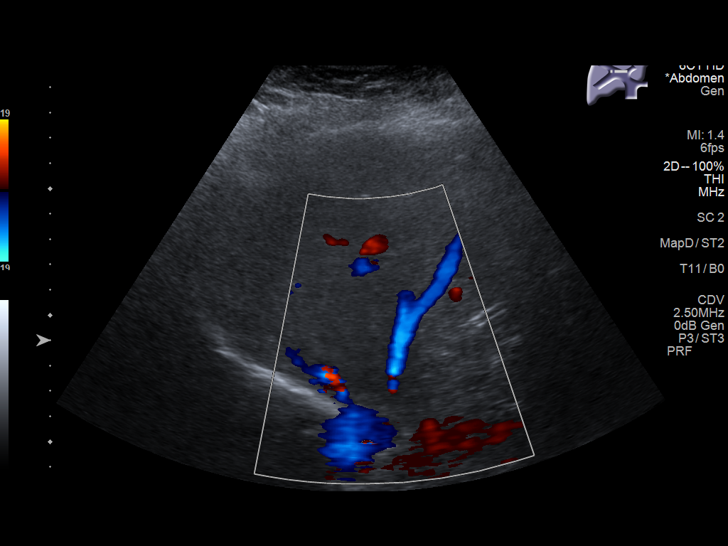
[im 34/90]
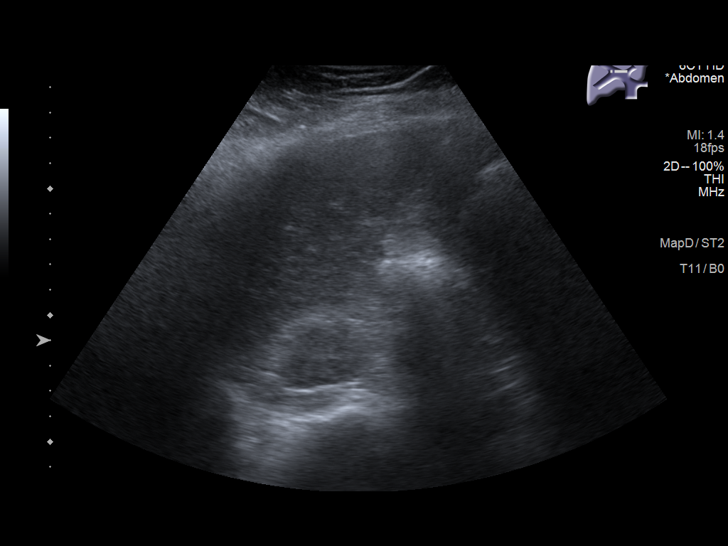
[im 41/90]
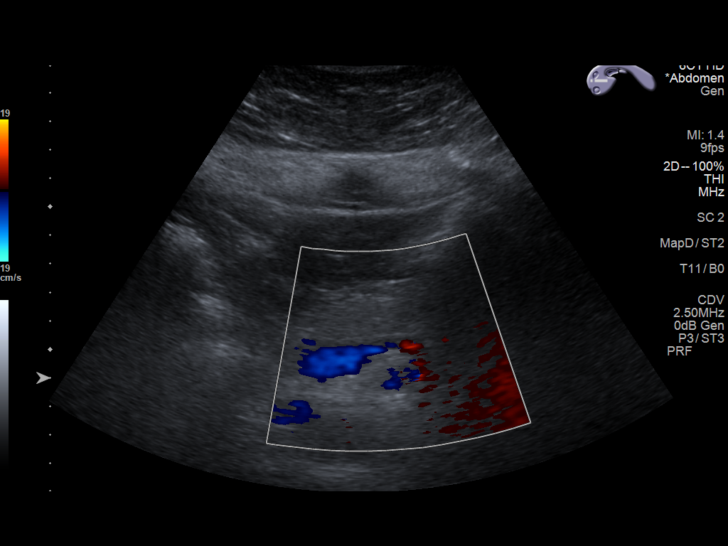
[im 49/90]
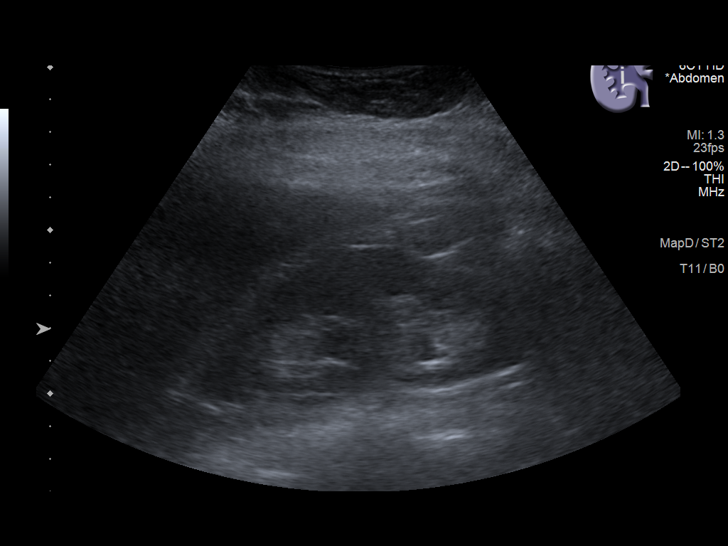
[im 56/90]
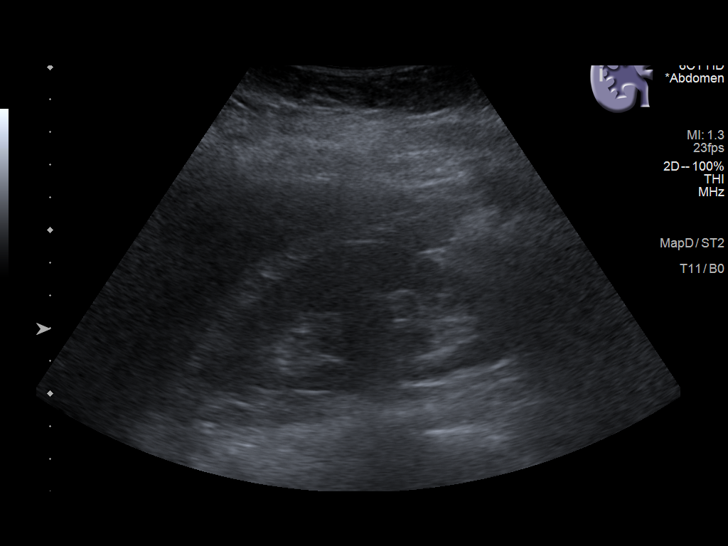
[im 60/90]
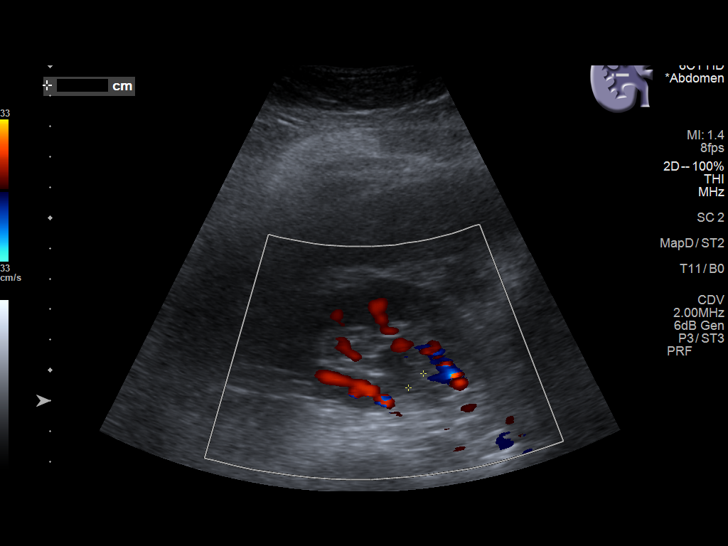
[im 67/90]
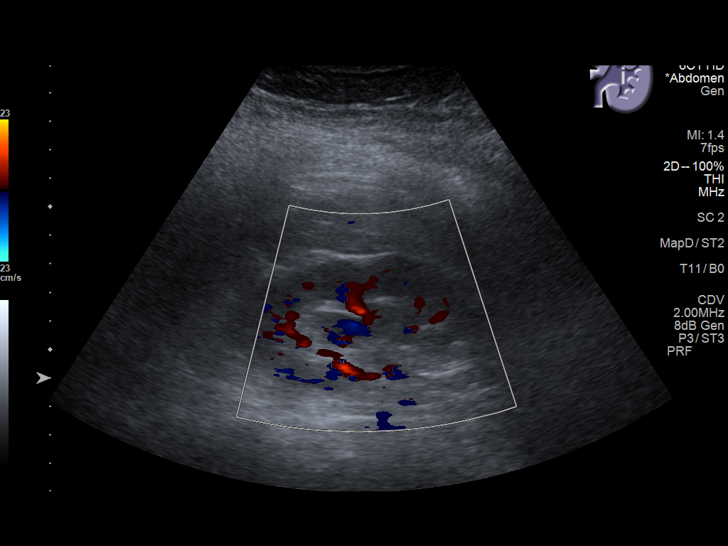
[im 75/90]
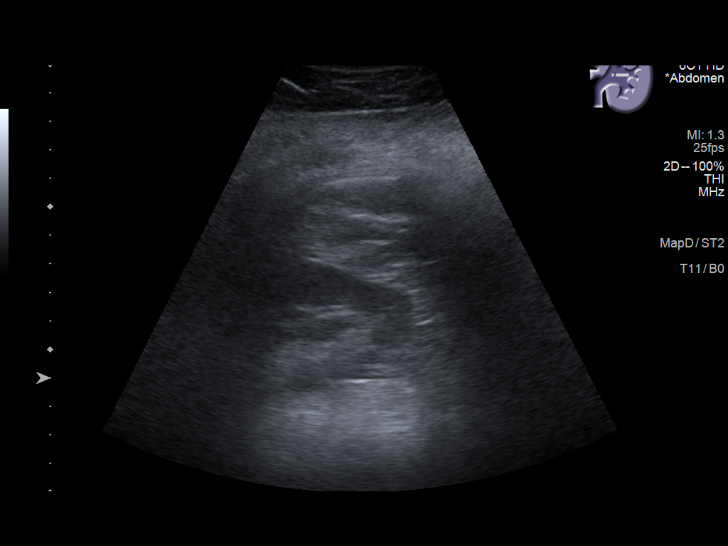
[im 82/90]
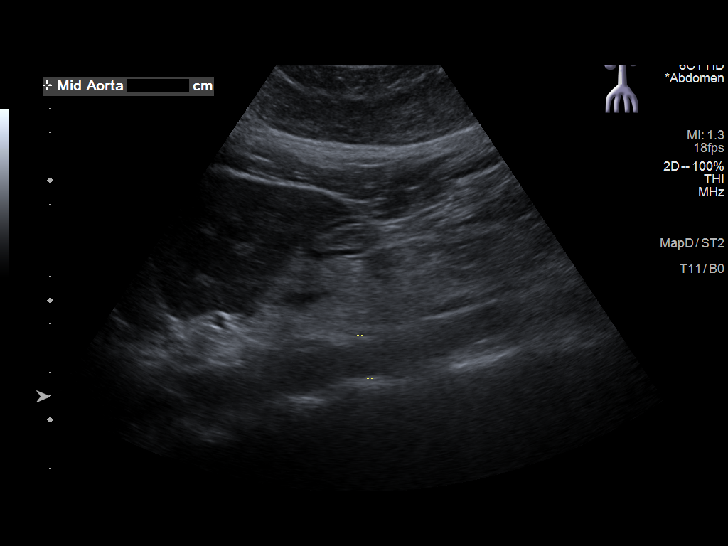
[im 90/90]
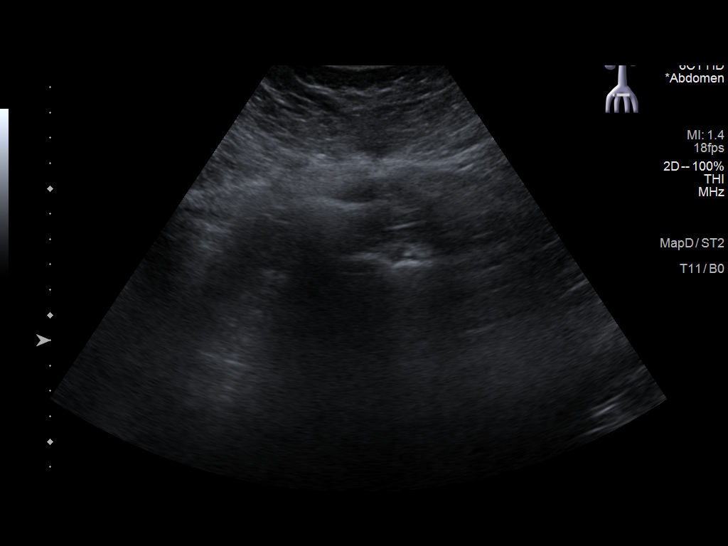

[14 of 25 positions shown; findings below may reference images not displayed]

FINDINGS: Gallbladder: No gallstones or wall thickening visualized. No
sonographic Murphy sign noted by sonographer.

Common bile duct: Diameter: 0.2 cm, within normal limits

Liver: No focal lesion identified. Within normal limits in
parenchymal echogenicity. Portal vein is patent on color Doppler
imaging with normal direction of blood flow towards the liver.

IVC: No abnormality visualized.

Pancreas: Visualized portion unremarkable.

Spleen: Size and appearance within normal limits.

Right Kidney: Length: 10.4. Echogenicity within normal limits. No
mass visualized. There is mild right hydronephrosis. No definite
renal calculus.

Left Kidney: Length: 10.2. Echogenicity within normal limits. No
mass or hydronephrosis visualized.

Abdominal aorta: No aneurysm visualized.

Other findings: None.
IMPRESSION: 1. Mild right kidney hydronephrosis. No mass or definite renal
calculus.

2.  Otherwise unremarkable sonographic evaluation of the abdomen.

## 2022-08-28 ENCOUNTER — Ambulatory Visit: Payer: BC Managed Care – PPO | Admitting: Physician Assistant

## 2022-09-05 ENCOUNTER — Other Ambulatory Visit: Payer: Self-pay | Admitting: Physician Assistant

## 2022-09-05 DIAGNOSIS — Z3041 Encounter for surveillance of contraceptive pills: Secondary | ICD-10-CM

## 2022-10-21 ENCOUNTER — Telehealth: Payer: Self-pay | Admitting: Physician Assistant

## 2022-10-21 NOTE — Telephone Encounter (Signed)
Lvm to pt to rescedule cancel appt on 09/01/22

## 2023-02-18 ENCOUNTER — Other Ambulatory Visit: Payer: Self-pay | Admitting: Physician Assistant

## 2023-02-18 DIAGNOSIS — Z3041 Encounter for surveillance of contraceptive pills: Secondary | ICD-10-CM

## 2023-02-24 ENCOUNTER — Other Ambulatory Visit: Payer: Self-pay | Admitting: Physician Assistant

## 2023-02-24 DIAGNOSIS — K219 Gastro-esophageal reflux disease without esophagitis: Secondary | ICD-10-CM

## 2023-02-24 DIAGNOSIS — I10 Essential (primary) hypertension: Secondary | ICD-10-CM

## 2023-03-12 ENCOUNTER — Encounter: Payer: Self-pay | Admitting: Physician Assistant

## 2023-03-12 ENCOUNTER — Ambulatory Visit: Payer: BC Managed Care – PPO | Admitting: Physician Assistant

## 2023-03-12 VITALS — BP 138/84 | HR 90 | Temp 98.4°F | Resp 16 | Ht 59.0 in | Wt 186.0 lb

## 2023-03-12 DIAGNOSIS — Z3041 Encounter for surveillance of contraceptive pills: Secondary | ICD-10-CM | POA: Diagnosis not present

## 2023-03-12 DIAGNOSIS — R300 Dysuria: Secondary | ICD-10-CM | POA: Diagnosis not present

## 2023-03-12 DIAGNOSIS — R7309 Other abnormal glucose: Secondary | ICD-10-CM

## 2023-03-12 DIAGNOSIS — R319 Hematuria, unspecified: Secondary | ICD-10-CM | POA: Diagnosis not present

## 2023-03-12 DIAGNOSIS — R5383 Other fatigue: Secondary | ICD-10-CM

## 2023-03-12 DIAGNOSIS — N390 Urinary tract infection, site not specified: Secondary | ICD-10-CM | POA: Diagnosis not present

## 2023-03-12 DIAGNOSIS — Z1231 Encounter for screening mammogram for malignant neoplasm of breast: Secondary | ICD-10-CM

## 2023-03-12 DIAGNOSIS — R946 Abnormal results of thyroid function studies: Secondary | ICD-10-CM

## 2023-03-12 DIAGNOSIS — E782 Mixed hyperlipidemia: Secondary | ICD-10-CM | POA: Diagnosis not present

## 2023-03-12 DIAGNOSIS — E559 Vitamin D deficiency, unspecified: Secondary | ICD-10-CM

## 2023-03-12 DIAGNOSIS — E538 Deficiency of other specified B group vitamins: Secondary | ICD-10-CM

## 2023-03-12 LAB — POCT URINALYSIS DIPSTICK
Bilirubin, UA: NEGATIVE
Blood, UA: POSITIVE
Glucose, UA: NEGATIVE
Ketones, UA: NEGATIVE
Nitrite, UA: NEGATIVE
Protein, UA: NEGATIVE
Spec Grav, UA: 1.01 (ref 1.010–1.025)
Urobilinogen, UA: 0.2 U/dL
pH, UA: 5 (ref 5.0–8.0)

## 2023-03-12 MED ORDER — NITROFURANTOIN MONOHYD MACRO 100 MG PO CAPS: ORAL_CAPSULE | ORAL | 0 refills | Status: DC

## 2023-03-12 MED ORDER — TRI-LO-MARZIA 0.18/0.215/0.25 MG-25 MCG PO TABS: 1.0000 | ORAL_TABLET | Freq: Every day | ORAL | 3 refills | Status: DC

## 2023-03-12 NOTE — Progress Notes (Signed)
[facility] [address] [place], [place] [address]  Internal MEDICINE  Office Visit Note  Patient Name: Lauren Marsh  [id number]  [id number]  Date of Service: 03/22/2023  Chief Complaint  Patient presents with   Follow-up   Gastroesophageal Reflux   Hypertension   Medication Refill   Urinary Tract Infection   Quality Metric Gaps    Mammogram    HPI Pt is here for routine follow up -UTI sx started last Monday and did OTC treatment and helped for 2 days, but then pressure and urgency started and now more pain and frequency continued this week. -BP not checked recently -forgets to take crestor, would like to recheck labs -rarely uses trazodone -back going into the office, finds she is more tired getting home, Some mood fluctuations. Not interested in medication at this time -she is worried about mom who is having memory concerns and plans to go with her to future appts with neurology -needs mammogram  Current Medication: Outpatient Encounter Medications as of 03/12/2023  Medication Sig   acetaminophen (TYLENOL) 500 MG tablet Take by mouth.   amLODipine (NORVASC) 10 MG tablet TAKE 1 TABLET BY MOUTH EVERY DAY   nitrofurantoin, macrocrystal-monohydrate, (MACROBID) 100 MG capsule Take 1 cap twice per day for 10 days.   pantoprazole (PROTONIX) 40 MG tablet TAKE 1 TABLET BY MOUTH EVERY DAY IN THE MORNING   rosuvastatin (CRESTOR) 5 MG tablet TAKE 1 TABLET BY MOUTH AT NIGHT TWICE PER WEEK   traZODone (DESYREL) 50 MG tablet TAKE 1 TABLET BY MOUTH EVERYDAY AT BEDTIME   [DISCONTINUED] TRI-LO-MARZIA 0.18/0.215/0.25 MG-25 MCG TABS USE AS DIRECTED FOR 28 DAYS   TRI-LO-MARZIA 0.18/0.215/0.25 MG-25 MCG TABS Take 1 tablet by mouth daily. Use as directed for 28 days   No facility-administered encounter medications on file as of 03/12/2023.    Surgical History: Past Surgical History:  Procedure Laterality Date   ESOPHAGOGASTRODUODENOSCOPY (EGD) WITH PROPOFOL N/A 07/24/2020    Procedure: ESOPHAGOGASTRODUODENOSCOPY (EGD) WITH PROPOFOL;  Surgeon: Toney Reil, MD;  Location: [facility] ENDOSCOPY;  Service: Gastroenterology;  Laterality: N/A;   EYE SURGERY      Medical History: Past Medical History:  Diagnosis Date   GERD (gastroesophageal reflux disease)    Hypertension     Family History: Family History  Problem Relation Age of Onset   Hypertension Mother    Hypertension Father     Social History   Socioeconomic History   Marital status: Single    Spouse name: Not on file   Number of children: Not on file   Years of education: Not on file   Highest education level: Not on file  Occupational History   Not on file  Tobacco Use   Smoking status: Never   Smokeless tobacco: Never  Vaping Use   Vaping status: Never Used  Substance and Sexual Activity   Alcohol use: Yes    Alcohol/week: 1.0 standard drink of alcohol    Types: 1 Cans of beer per week    Comment: occ.   Drug use: Never   Sexual activity: Not on file  Other Topics Concern   Not on file  Social History Narrative   Not on file   Social Drivers of Health   Financial Resource Strain: Not on file  Food Insecurity: Not on file  Transportation Needs: Not on file  Physical Activity: Not on file  Stress: Not on file  Social Connections: Not on file  Intimate Partner Violence: Not on file  Review of Systems  Constitutional:  Positive for fatigue. Negative for chills and unexpected weight change.  HENT:  Negative for congestion, postnasal drip, rhinorrhea, sneezing and sore throat.   Eyes:  Negative for redness.  Respiratory:  Negative for cough, chest tightness and shortness of breath.   Cardiovascular:  Negative for chest pain and palpitations.  Gastrointestinal:  Negative for abdominal pain, constipation, diarrhea, nausea and vomiting.  Genitourinary:  Positive for dysuria, frequency and urgency. Negative for flank pain.  Musculoskeletal:  Negative for arthralgias,  back pain, joint swelling and neck pain.  Skin:  Negative for rash.  Neurological: Negative.  Negative for tremors and numbness.  Hematological:  Negative for adenopathy. Does not bruise/bleed easily.  Psychiatric/Behavioral:  Negative for behavioral problems (Depression) and suicidal ideas. The patient is nervous/anxious.     Vital Signs: BP 138/84 Comment: 140/82  Pulse 90 Comment: 109  Temp 98.4 F (36.9 C)   Resp 16   Ht 4\' 11"  (1.499 m)   Wt 186 lb (84.4 kg)   SpO2 97%   BMI 37.57 kg/m    Physical Exam Vitals and nursing note reviewed.  Constitutional:      General: She is not in acute distress.    Appearance: She is well-developed. She is obese. She is not diaphoretic.  HENT:     Head: Normocephalic and atraumatic.     Mouth/Throat:     Pharynx: No oropharyngeal exudate.  Eyes:     Pupils: Pupils are equal, round, and reactive to light.  Neck:     Thyroid: No thyromegaly.     Vascular: No JVD.     Trachea: No tracheal deviation.  Cardiovascular:     Rate and Rhythm: Normal rate and regular rhythm.     Heart sounds: Normal heart sounds. No murmur heard.    No friction rub. No gallop.  Pulmonary:     Effort: Pulmonary effort is normal. No respiratory distress.     Breath sounds: No wheezing or rales.  Chest:     Chest wall: No tenderness.  Breasts:    Right: Normal. No mass.     Left: Normal. No mass.  Abdominal:     General: Bowel sounds are normal.     Palpations: Abdomen is soft.  Musculoskeletal:        General: Normal range of motion.     Cervical back: Normal range of motion and neck supple.     Right lower leg: No edema.     Left lower leg: No edema.  Lymphadenopathy:     Cervical: No cervical adenopathy.  Skin:    General: Skin is warm and dry.     Findings: No erythema, lesion or rash.  Neurological:     Mental Status: She is alert and oriented to person, place, and time.     Cranial Nerves: No cranial nerve deficit.  Psychiatric:         Behavior: Behavior normal.        Thought Content: Thought content normal.        Judgment: Judgment normal.        Assessment/Plan: 1. Urinary tract infection with hematuria, site unspecified (Primary) Will start macrobid and adjust based on C/S - CULTURE, URINE COMPREHENSIVE - nitrofurantoin, macrocrystal-monohydrate, (MACROBID) 100 MG capsule; Take 1 cap twice per day for 10 days.  Dispense: 20 capsule; Refill: 0  2. Dysuria - POCT Urinalysis Dipstick  3. Oral contraceptive use - TRI-LO-MARZIA 0.18/0.215/0.25 MG-25 MCG TABS; Take 1 tablet  by mouth daily. Use as directed for 28 days  Dispense: 84 tablet; Refill: 3  4. Mixed hyperlipidemia Taking crestor only sometimes, will update labs - Lipid Panel With LDL/HDL Ratio  5. Abnormal thyroid exam - TSH + free T4  6. Increased glucose level - Hgb A1C w/o eAG  7. B12 deficiency - B12 and Folate Panel  8. Vitamin D deficiency - VITAMIN D 25 Hydroxy (Vit-D Deficiency, Fractures)  9. Visit for screening mammogram - MM 3D SCREENING MAMMOGRAM BILATERAL BREAST; Future  10. Other fatigue - CBC w/Diff/Platelet - Comprehensive metabolic panel - TSH + free T4 - Lipid Panel With LDL/HDL Ratio - B12 and Folate Panel - VITAMIN D 25 Hydroxy (Vit-D Deficiency, Fractures) - Fe+TIBC+Fer - Hgb A1C w/o eAG   General Counseling: Itxel verbalizes understanding of the findings of todays visit and agrees with plan of treatment. I have discussed any further diagnostic evaluation that may be needed or ordered today. We also reviewed her medications today. she has been encouraged to call the office with any questions or concerns that should arise related to todays visit.    Orders Placed This Encounter  Procedures   CULTURE, URINE COMPREHENSIVE   MM 3D SCREENING MAMMOGRAM BILATERAL BREAST   CBC w/Diff/Platelet   Comprehensive metabolic panel   TSH + free T4   Lipid Panel With LDL/HDL Ratio   B12 and Folate Panel   VITAMIN D 25  Hydroxy (Vit-D Deficiency, Fractures)   Fe+TIBC+Fer   Hgb A1C w/o eAG   POCT Urinalysis Dipstick    Meds ordered this encounter  Medications   nitrofurantoin, macrocrystal-monohydrate, (MACROBID) 100 MG capsule    Sig: Take 1 cap twice per day for 10 days.    Dispense:  20 capsule    Refill:  0   TRI-LO-MARZIA 0.18/0.215/0.25 MG-25 MCG TABS    Sig: Take 1 tablet by mouth daily. Use as directed for 28 days    Dispense:  84 tablet    Refill:  3    This patient was seen by Lynn Ito, PA-C in collaboration with Dr. Beverely Risen as a part of collaborative care agreement.   Total time spent:30 Minutes Time spent includes review of chart, medications, test results, and follow up plan with the patient.      Dr Lyndon Code Internal medicine

## 2023-03-16 ENCOUNTER — Telehealth: Payer: Self-pay

## 2023-03-16 LAB — CULTURE, URINE COMPREHENSIVE

## 2023-03-16 NOTE — Telephone Encounter (Signed)
Spoke with patient regarding urine culture results. 

## 2023-03-16 NOTE — Telephone Encounter (Signed)
-----   Message from Lauren Marsh sent at 03/16/2023 12:52 PM EST ----- Please let her know she is on appropriate ABX for UTI

## 2023-03-20 DIAGNOSIS — Z1231 Encounter for screening mammogram for malignant neoplasm of breast: Secondary | ICD-10-CM | POA: Diagnosis not present

## 2023-04-22 DIAGNOSIS — R946 Abnormal results of thyroid function studies: Secondary | ICD-10-CM | POA: Diagnosis not present

## 2023-04-22 DIAGNOSIS — E782 Mixed hyperlipidemia: Secondary | ICD-10-CM | POA: Diagnosis not present

## 2023-04-22 DIAGNOSIS — E538 Deficiency of other specified B group vitamins: Secondary | ICD-10-CM | POA: Diagnosis not present

## 2023-04-22 DIAGNOSIS — E559 Vitamin D deficiency, unspecified: Secondary | ICD-10-CM | POA: Diagnosis not present

## 2023-04-22 DIAGNOSIS — R7309 Other abnormal glucose: Secondary | ICD-10-CM | POA: Diagnosis not present

## 2023-04-22 DIAGNOSIS — R5383 Other fatigue: Secondary | ICD-10-CM | POA: Diagnosis not present

## 2023-04-23 LAB — CBC WITH DIFFERENTIAL/PLATELET
Basophils Absolute: 0.1 10*3/uL (ref 0.0–0.2)
Basos: 1 %
EOS (ABSOLUTE): 0.4 10*3/uL (ref 0.0–0.4)
Eos: 5 %
Hematocrit: 40.5 % (ref 34.0–46.6)
Hemoglobin: 13.3 g/dL (ref 11.1–15.9)
Immature Grans (Abs): 0 10*3/uL (ref 0.0–0.1)
Immature Granulocytes: 0 %
Lymphocytes Absolute: 1.5 10*3/uL (ref 0.7–3.1)
Lymphs: 23 %
MCH: 30 pg (ref 26.6–33.0)
MCHC: 32.8 g/dL (ref 31.5–35.7)
MCV: 91 fL (ref 79–97)
Monocytes Absolute: 0.5 10*3/uL (ref 0.1–0.9)
Monocytes: 7 %
Neutrophils Absolute: 4.2 10*3/uL (ref 1.4–7.0)
Neutrophils: 64 %
Platelets: 364 10*3/uL (ref 150–450)
RBC: 4.44 x10E6/uL (ref 3.77–5.28)
RDW: 12.3 % (ref 11.7–15.4)
WBC: 6.5 10*3/uL (ref 3.4–10.8)

## 2023-04-23 LAB — IRON,TIBC AND FERRITIN PANEL
Ferritin: 86 ng/mL (ref 15–150)
Iron Saturation: 35 % (ref 15–55)
Iron: 121 ug/dL (ref 27–159)
Total Iron Binding Capacity: 344 ug/dL (ref 250–450)
UIBC: 223 ug/dL (ref 131–425)

## 2023-04-23 LAB — LIPID PANEL WITH LDL/HDL RATIO
Cholesterol, Total: 228 mg/dL — ABNORMAL HIGH (ref 100–199)
HDL: 72 mg/dL (ref >39)
LDL Chol Calc (NIH): 132 mg/dL — ABNORMAL HIGH (ref 0–99)
LDL/HDL Ratio: 1.8 ratio (ref 0.0–3.2)
Triglycerides: 135 mg/dL (ref 0–149)
VLDL Cholesterol Cal: 24 mg/dL (ref 5–40)

## 2023-04-23 LAB — COMPREHENSIVE METABOLIC PANEL
ALT: 17 IU/L (ref 0–32)
AST: 17 IU/L (ref 0–40)
Albumin: 4.2 g/dL (ref 3.8–4.9)
Alkaline Phosphatase: 61 IU/L (ref 44–121)
BUN/Creatinine Ratio: 12 (ref 9–23)
BUN: 10 mg/dL (ref 6–24)
Bilirubin Total: 0.5 mg/dL (ref 0.0–1.2)
CO2: 21 mmol/L (ref 20–29)
Calcium: 9.2 mg/dL (ref 8.7–10.2)
Chloride: 104 mmol/L (ref 96–106)
Creatinine, Ser: 0.83 mg/dL (ref 0.57–1.00)
Globulin, Total: 2.4 g/dL (ref 1.5–4.5)
Glucose: 93 mg/dL (ref 70–99)
Potassium: 4.1 mmol/L (ref 3.5–5.2)
Sodium: 139 mmol/L (ref 134–144)
Total Protein: 6.6 g/dL (ref 6.0–8.5)
eGFR: 81 mL/min/{1.73_m2} (ref >59)

## 2023-04-23 LAB — B12 AND FOLATE PANEL
Folate: 5.2 ng/mL (ref >3.0)
Vitamin B-12: 331 pg/mL (ref 232–1245)

## 2023-04-23 LAB — TSH+FREE T4
Free T4: 1.06 ng/dL (ref 0.82–1.77)
TSH: 3.56 u[IU]/mL (ref 0.450–4.500)

## 2023-04-23 LAB — HGB A1C W/O EAG: Hgb A1c MFr Bld: 5 % (ref 4.8–5.6)

## 2023-04-23 LAB — VITAMIN D 25 HYDROXY (VIT D DEFICIENCY, FRACTURES): Vit D, 25-Hydroxy: 17.4 ng/mL — ABNORMAL LOW (ref 30.0–100.0)

## 2023-04-30 ENCOUNTER — Encounter: Payer: Self-pay | Admitting: Physician Assistant

## 2023-04-30 ENCOUNTER — Ambulatory Visit: Payer: BC Managed Care – PPO | Admitting: Physician Assistant

## 2023-04-30 VITALS — BP 136/78 | HR 74 | Temp 98.0°F | Resp 16 | Ht 59.0 in | Wt 187.0 lb

## 2023-04-30 DIAGNOSIS — E559 Vitamin D deficiency, unspecified: Secondary | ICD-10-CM | POA: Diagnosis not present

## 2023-04-30 DIAGNOSIS — Z0001 Encounter for general adult medical examination with abnormal findings: Secondary | ICD-10-CM

## 2023-04-30 DIAGNOSIS — Z124 Encounter for screening for malignant neoplasm of cervix: Secondary | ICD-10-CM

## 2023-04-30 DIAGNOSIS — I10 Essential (primary) hypertension: Secondary | ICD-10-CM | POA: Diagnosis not present

## 2023-04-30 DIAGNOSIS — E782 Mixed hyperlipidemia: Secondary | ICD-10-CM

## 2023-04-30 MED ORDER — ERGOCALCIFEROL 1.25 MG (50000 UT) PO CAPS: ORAL_CAPSULE | ORAL | 3 refills | Status: AC

## 2023-04-30 NOTE — Progress Notes (Signed)
 [facility] [address] [place], [place] [address]  Internal MEDICINE  Office Visit Note  Patient Name: Lauren Marsh  [id number]  [id number]  Date of Service: 05/08/2023  Chief Complaint  Patient presents with   Annual Exam    Review Labs   Gastroesophageal Reflux   Hypertension     HPI Pt is here for routine health maintenance examination -Due for pap in Nov 2025 and wants to wait until then -Colonoscopy due next year -Mammogram done in Feb -declines vaccines -Labs reviewed: cholesterol back elevated again--forgets to take crestor and will do better with this, Vit D low and will start drisdol -BP stable -worried about mom's memory and is trying to go with her to her neurology appts  Current Medication: Outpatient Encounter Medications as of 04/30/2023  Medication Sig   acetaminophen (TYLENOL) 500 MG tablet Take by mouth.   amLODipine (NORVASC) 10 MG tablet TAKE 1 TABLET BY MOUTH EVERY DAY   ergocalciferol (DRISDOL) 1.25 MG (50000 UT) capsule Take one cap q week   pantoprazole (PROTONIX) 40 MG tablet TAKE 1 TABLET BY MOUTH EVERY DAY IN THE MORNING   rosuvastatin (CRESTOR) 5 MG tablet TAKE 1 TABLET BY MOUTH AT NIGHT TWICE PER WEEK   traZODone (DESYREL) 50 MG tablet TAKE 1 TABLET BY MOUTH EVERYDAY AT BEDTIME   TRI-LO-MARZIA 0.18/0.215/0.25 MG-25 MCG TABS Take 1 tablet by mouth daily. Use as directed for 28 days   [DISCONTINUED] nitrofurantoin, macrocrystal-monohydrate, (MACROBID) 100 MG capsule Take 1 cap twice per day for 10 days.   No facility-administered encounter medications on file as of 04/30/2023.    Surgical History: Past Surgical History:  Procedure Laterality Date   ESOPHAGOGASTRODUODENOSCOPY (EGD) WITH PROPOFOL N/A 07/24/2020   Procedure: ESOPHAGOGASTRODUODENOSCOPY (EGD) WITH PROPOFOL;  Surgeon: Toney Reil, MD;  Location: [facility] ENDOSCOPY;  Service: Gastroenterology;  Laterality: N/A;   EYE SURGERY      Medical History: Past  Medical History:  Diagnosis Date   GERD (gastroesophageal reflux disease)    Hypertension     Family History: Family History  Problem Relation Age of Onset   Hypertension Mother    Hypertension Father       Review of Systems  Constitutional:  Negative for chills, fatigue and unexpected weight change.  HENT:  Negative for congestion, rhinorrhea, sneezing and sore throat.   Eyes:  Negative for redness.  Respiratory:  Negative for cough, chest tightness and shortness of breath.   Cardiovascular:  Negative for chest pain and palpitations.  Gastrointestinal:  Negative for abdominal pain, constipation, diarrhea, nausea and vomiting.  Genitourinary:  Negative for dysuria and frequency.  Musculoskeletal:  Negative for arthralgias, back pain, joint swelling and neck pain.  Skin:  Negative for rash.  Neurological: Negative.  Negative for tremors and numbness.  Hematological:  Negative for adenopathy. Does not bruise/bleed easily.  Psychiatric/Behavioral:  Negative for behavioral problems (Depression), sleep disturbance and suicidal ideas. The patient is not nervous/anxious.      Vital Signs: BP 136/78 Comment: 140/70  Pulse 74   Temp 98 F (36.7 C)   Resp 16   Ht 4\' 11"  (1.499 m)   Wt 187 lb (84.8 kg)   SpO2 99%   BMI 37.77 kg/m    Physical Exam Vitals and nursing note reviewed.  Constitutional:      General: She is not in acute distress.    Appearance: She is well-developed. She is obese. She is not diaphoretic.  HENT:     Head: Normocephalic  and atraumatic.     Mouth/Throat:     Pharynx: No oropharyngeal exudate.  Eyes:     Pupils: Pupils are equal, round, and reactive to light.  Neck:     Thyroid: No thyromegaly.     Vascular: No JVD.     Trachea: No tracheal deviation.  Cardiovascular:     Rate and Rhythm: Normal rate and regular rhythm.     Heart sounds: Normal heart sounds. No murmur heard.    No friction rub. No gallop.  Pulmonary:     Effort: Pulmonary  effort is normal. No respiratory distress.     Breath sounds: No wheezing or rales.  Chest:     Chest wall: No tenderness.  Breasts:    Right: Normal. No mass.     Left: Normal. No mass.  Abdominal:     General: Bowel sounds are normal.     Palpations: Abdomen is soft.  Musculoskeletal:        General: Normal range of motion.     Cervical back: Normal range of motion and neck supple.     Right lower leg: No edema.     Left lower leg: No edema.  Lymphadenopathy:     Cervical: No cervical adenopathy.  Skin:    General: Skin is warm and dry.     Findings: No erythema, lesion or rash.  Neurological:     Mental Status: She is alert and oriented to person, place, and time.     Cranial Nerves: No cranial nerve deficit.  Psychiatric:        Behavior: Behavior normal.        Thought Content: Thought content normal.        Judgment: Judgment normal.      LABS: Recent Results (from the past 2160 hours)  POCT Urinalysis Dipstick     Status: Abnormal   Collection Time: 03/12/23  1:43 PM  Result Value Ref Range   Color, UA     Clarity, UA     Glucose, UA Negative Negative   Bilirubin, UA Negative    Ketones, UA Negative    Spec Grav, UA 1.010 1.010 - 1.025   Blood, UA Positive    pH, UA 5.0 5.0 - 8.0   Protein, UA Negative Negative   Urobilinogen, UA 0.2 0.2 or 1.0 E.U./dL   Nitrite, UA Negative    Leukocytes, UA Moderate (2+) (A) Negative   Appearance     Odor    CULTURE, URINE COMPREHENSIVE     Status: Abnormal   Collection Time: 03/12/23  2:59 PM   Specimen: Urine   Urine  Result Value Ref Range   Urine Culture, Comprehensive Final report (A)    Organism ID, Bacteria Escherichia coli (A)     Comment: Cefazolin <=4 ug/mL Cefazolin with an MIC <=16 predicts susceptibility to the oral agents cefaclor, cefdinir, cefpodoxime, cefprozil, cefuroxime, cephalexin, and loracarbef when used for therapy of uncomplicated urinary tract infections due to E. coli, Klebsiella  pneumoniae, and Proteus mirabilis. 25,000-50,000 colony forming units per mL    ANTIMICROBIAL SUSCEPTIBILITY Comment     Comment:       ** S = Susceptible; I = Intermediate; R = Resistant **                    P = Positive; N = Negative             MICS are expressed in micrograms per mL    Antibiotic  RSLT#1    RSLT#2    RSLT#3    RSLT#4 Amoxicillin/Clavulanic Acid    S Ampicillin                     S Cefepime                       S Ceftriaxone                    S Cefuroxime                     S Ciprofloxacin                  S Ertapenem                      S Gentamicin                     S Imipenem                       S Levofloxacin                   S Meropenem                      S Nitrofurantoin                 S Piperacillin/Tazobactam        S Tetracycline                   S Tobramycin                     S Trimethoprim/Sulfa             S   CBC w/Diff/Platelet     Status: None   Collection Time: 04/22/23  7:15 AM  Result Value Ref Range   WBC 6.5 3.4 - 10.8 x10E3/uL   RBC 4.44 3.77 - 5.28 x10E6/uL   Hemoglobin 13.3 11.1 - 15.9 g/dL   Hematocrit 16.1 09.6 - 46.6 %   MCV 91 79 - 97 fL   MCH 30.0 26.6 - 33.0 pg   MCHC 32.8 31.5 - 35.7 g/dL   RDW 04.5 40.9 - 81.1 %   Platelets 364 150 - 450 x10E3/uL   Neutrophils 64 Not Estab. %   Lymphs 23 Not Estab. %   Monocytes 7 Not Estab. %   Eos 5 Not Estab. %   Basos 1 Not Estab. %   Neutrophils Absolute 4.2 1.4 - 7.0 x10E3/uL   Lymphocytes Absolute 1.5 0.7 - 3.1 x10E3/uL   Monocytes Absolute 0.5 0.1 - 0.9 x10E3/uL   EOS (ABSOLUTE) 0.4 0.0 - 0.4 x10E3/uL   Basophils Absolute 0.1 0.0 - 0.2 x10E3/uL   Immature Granulocytes 0 Not Estab. %   Immature Grans (Abs) 0.0 0.0 - 0.1 x10E3/uL  Comprehensive metabolic panel     Status: None   Collection Time: 04/22/23  7:15 AM  Result Value Ref Range   Glucose 93 70 - 99 mg/dL   BUN 10 6 - 24 mg/dL   Creatinine, Ser 9.14 0.57 - 1.00 mg/dL   eGFR 81 >78  GN/FAO/1.30   BUN/Creatinine Ratio 12 9 - 23   Sodium 139 134 - 144 mmol/L   Potassium 4.1 3.5 - 5.2 mmol/L   Chloride 104 96 -  106 mmol/L   CO2 21 20 - 29 mmol/L   Calcium 9.2 8.7 - 10.2 mg/dL   Total Protein 6.6 6.0 - 8.5 g/dL   Albumin 4.2 3.8 - 4.9 g/dL   Globulin, Total 2.4 1.5 - 4.5 g/dL   Bilirubin Total 0.5 0.0 - 1.2 mg/dL   Alkaline Phosphatase 61 44 - 121 IU/L   AST 17 0 - 40 IU/L   ALT 17 0 - 32 IU/L  TSH + free T4     Status: None   Collection Time: 04/22/23  7:15 AM  Result Value Ref Range   TSH 3.560 0.450 - 4.500 uIU/mL   Free T4 1.06 0.82 - 1.77 ng/dL  Lipid Panel With LDL/HDL Ratio     Status: Abnormal   Collection Time: 04/22/23  7:15 AM  Result Value Ref Range   Cholesterol, Total 228 (H) 100 - 199 mg/dL   Triglycerides 161 0 - 149 mg/dL   HDL 72 >09 mg/dL   VLDL Cholesterol Cal 24 5 - 40 mg/dL   LDL Chol Calc (NIH) 604 (H) 0 - 99 mg/dL   LDL/HDL Ratio 1.8 0.0 - 3.2 ratio    Comment:                                     LDL/HDL Ratio                                             Men  Women                               1/2 Avg.Risk  1.0    1.5                                   Avg.Risk  3.6    3.2                                2X Avg.Risk  6.2    5.0                                3X Avg.Risk  8.0    6.1   B12 and Folate Panel     Status: None   Collection Time: 04/22/23  7:15 AM  Result Value Ref Range   Vitamin B-12 331 232 - 1,245 pg/mL   Folate 5.2 >3.0 ng/mL    Comment: A serum folate concentration of less than 3.1 ng/mL is considered to represent clinical deficiency.   VITAMIN D 25 Hydroxy (Vit-D Deficiency, Fractures)     Status: Abnormal   Collection Time: 04/22/23  7:15 AM  Result Value Ref Range   Vit D, 25-Hydroxy 17.4 (L) 30.0 - 100.0 ng/mL    Comment: Vitamin D deficiency has been defined by the Institute of Medicine and an Endocrine Society practice guideline as a level of serum 25-OH vitamin D less than 20 ng/mL (1,2). The Endocrine  Society went on to further define vitamin D insufficiency as a level between 21 and 29 ng/mL (2). 1. IOM (  Institute of Medicine). 2010. Dietary reference    intakes for calcium and D. [place]: The    [employer]. 2. Holick MF, Binkley [place]caw, Bischoff-Ferrari HA, et al.    Evaluation, treatment, and prevention of vitamin D    deficiency: an Endocrine Society clinical practice    guideline. JCEM. 2011 Jul; 96(7):1911-30.   Fe+TIBC+Fer     Status: None   Collection Time: 04/22/23  7:15 AM  Result Value Ref Range   Total Iron Binding Capacity 344 250 - 450 ug/dL   UIBC 244 010 - 272 ug/dL   Iron 536 27 - 644 ug/dL   Iron Saturation 35 15 - 55 %   Ferritin 86 15 - 150 ng/mL  Hgb A1C w/o eAG     Status: None   Collection Time: 04/22/23  7:15 AM  Result Value Ref Range   Hgb A1c MFr Bld 5.0 4.8 - 5.6 %    Comment:          Prediabetes: 5.7 - 6.4          Diabetes: >6.4          Glycemic control for adults with diabetes: <7.0         Assessment/Plan: 1. Encounter for general adult medical examination with abnormal findings (Primary) CPE performed, mammogram UTD, due for pap in Nov  2. Essential hypertension Stable, continue current medication  3. Mixed hyperlipidemia Increased and admits she has been forgetful about crestor and will do better taking again  4. Vitamin D deficiency - ergocalciferol (DRISDOL) 1.25 MG (50000 UT) capsule; Take one cap q week  Dispense: 12 capsule; Refill: 3   General Counseling: julieann drummonds understanding of the findings of todays visit and agrees with plan of treatment. I have discussed any further diagnostic evaluation that may be needed or ordered today. We also reviewed her medications today. she has been encouraged to call the office with any questions or concerns that should arise related to todays visit.    Counseling:    No orders of the defined types were placed in this encounter.   Meds ordered this  encounter  Medications   ergocalciferol (DRISDOL) 1.25 MG (50000 UT) capsule    Sig: Take one cap q week    Dispense:  12 capsule    Refill:  3    This patient was seen by Lynn Ito, PA-C in collaboration with Dr. Beverely Risen as a part of collaborative care agreement.  Total time spent:35 Minutes  Time spent includes review of chart, medications, test results, and follow up plan with the patient.     Lyndon Code, MD  Internal Medicine

## 2023-06-22 DIAGNOSIS — H02883 Meibomian gland dysfunction of right eye, unspecified eyelid: Secondary | ICD-10-CM | POA: Diagnosis not present

## 2023-06-22 DIAGNOSIS — H02886 Meibomian gland dysfunction of left eye, unspecified eyelid: Secondary | ICD-10-CM | POA: Diagnosis not present

## 2023-08-30 ENCOUNTER — Other Ambulatory Visit: Payer: Self-pay | Admitting: Physician Assistant

## 2023-08-30 DIAGNOSIS — K219 Gastro-esophageal reflux disease without esophagitis: Secondary | ICD-10-CM

## 2023-08-30 DIAGNOSIS — I10 Essential (primary) hypertension: Secondary | ICD-10-CM

## 2023-09-03 ENCOUNTER — Ambulatory Visit: Admitting: Physician Assistant

## 2023-09-03 ENCOUNTER — Encounter: Payer: Self-pay | Admitting: Physician Assistant

## 2023-09-03 VITALS — BP 110/72 | HR 92 | Temp 98.3°F | Resp 16 | Ht 59.0 in | Wt 188.8 lb

## 2023-09-03 DIAGNOSIS — I10 Essential (primary) hypertension: Secondary | ICD-10-CM | POA: Diagnosis not present

## 2023-09-03 DIAGNOSIS — E782 Mixed hyperlipidemia: Secondary | ICD-10-CM | POA: Diagnosis not present

## 2023-09-03 NOTE — Progress Notes (Signed)
 [facility] [address] [place], [place] [address]  Internal MEDICINE  Office Visit Note  Patient Name: Lauren Marsh  [id number]  [id number]  Date of Service: 09/03/2023  Chief Complaint  Patient presents with   Follow-up   Hypertension   Gastroesophageal Reflux    HPI Pt is here for routine follow up -skinnystix supplement that she adds to water. Has not had coffee since starting this for the last 2 months since starting it -BP greatly improved today, and may be improved since stopping coffee, though supplement does have caffiene. She will start monitoring BP at home, may be able to start cutting in half -pap due next visit -doing well with meds  Current Medication: Outpatient Encounter Medications as of 09/03/2023  Medication Sig   acetaminophen  (TYLENOL ) 500 MG tablet Take by mouth.   amLODipine  (NORVASC ) 10 MG tablet TAKE 1 TABLET BY MOUTH EVERY DAY   ergocalciferol  (DRISDOL ) 1.25 MG (50000 UT) capsule Take one cap q week   pantoprazole  (PROTONIX ) 40 MG tablet TAKE 1 TABLET BY MOUTH EVERY DAY IN THE MORNING   rosuvastatin  (CRESTOR ) 5 MG tablet TAKE 1 TABLET BY MOUTH AT NIGHT TWICE PER WEEK   traZODone  (DESYREL ) 50 MG tablet TAKE 1 TABLET BY MOUTH EVERYDAY AT BEDTIME   TRI-LO-MARZIA  0.18/0.215/0.25 MG-25 MCG TABS Take 1 tablet by mouth daily. Use as directed for 28 days   No facility-administered encounter medications on file as of 09/03/2023.    Surgical History: Past Surgical History:  Procedure Laterality Date   ESOPHAGOGASTRODUODENOSCOPY (EGD) WITH PROPOFOL  N/A 07/24/2020   Procedure: ESOPHAGOGASTRODUODENOSCOPY (EGD) WITH PROPOFOL ;  Surgeon: Unk Corinn Skiff, MD;  Location: [facility] ENDOSCOPY;  Service: Gastroenterology;  Laterality: N/A;   EYE SURGERY      Medical History: Past Medical History:  Diagnosis Date   GERD (gastroesophageal reflux disease)    Hypertension     Family History: Family History  Problem Relation Age of Onset    Hypertension Mother    Hypertension Father     Social History   Socioeconomic History   Marital status: Single    Spouse name: Not on file   Number of children: Not on file   Years of education: Not on file   Highest education level: Not on file  Occupational History   Not on file  Tobacco Use   Smoking status: Never   Smokeless tobacco: Never  Vaping Use   Vaping status: Never Used  Substance and Sexual Activity   Alcohol use: Yes    Alcohol/week: 1.0 standard drink of alcohol    Types: 1 Cans of beer per week    Comment: occ.   Drug use: Never   Sexual activity: Not on file  Other Topics Concern   Not on file  Social History Narrative   Not on file   Social Drivers of Health   Financial Resource Strain: Not on file  Food Insecurity: Not on file  Transportation Needs: Not on file  Physical Activity: Not on file  Stress: Not on file  Social Connections: Not on file  Intimate Partner Violence: Not on file      Review of Systems  Constitutional:  Negative for chills, fatigue and unexpected weight change.  HENT:  Negative for congestion, rhinorrhea, sneezing and sore throat.   Eyes:  Negative for redness.  Respiratory:  Negative for cough, chest tightness and shortness of breath.   Cardiovascular:  Negative for chest pain and palpitations.  Gastrointestinal:  Negative for abdominal  pain, constipation, diarrhea, nausea and vomiting.  Genitourinary:  Negative for dysuria and frequency.  Musculoskeletal:  Negative for arthralgias, back pain, joint swelling and neck pain.  Skin:  Negative for rash.  Neurological: Negative.  Negative for tremors and numbness.  Hematological:  Negative for adenopathy. Does not bruise/bleed easily.  Psychiatric/Behavioral:  Negative for behavioral problems (Depression), sleep disturbance and suicidal ideas. The patient is not nervous/anxious.     Vital Signs: BP 110/72   Pulse 92   Temp 98.3 F (36.8 C)   Resp 16   Ht 4' 11  (1.499 m)   Wt 188 lb 12.8 oz (85.6 kg)   SpO2 100%   BMI 38.13 kg/m    Physical Exam Vitals and nursing note reviewed.  Constitutional:      General: She is not in acute distress.    Appearance: She is well-developed. She is obese. She is not diaphoretic.  HENT:     Head: Normocephalic and atraumatic.  Eyes:     Extraocular Movements: Extraocular movements intact.  Neck:     Thyroid: No thyromegaly.     Vascular: No JVD.     Trachea: No tracheal deviation.  Cardiovascular:     Rate and Rhythm: Normal rate and regular rhythm.     Heart sounds: Normal heart sounds. No murmur heard.    No friction rub. No gallop.  Pulmonary:     Effort: Pulmonary effort is normal. No respiratory distress.     Breath sounds: No wheezing or rales.  Chest:     Chest wall: No tenderness.  Breasts:    Right: Normal. No mass.     Left: Normal. No mass.  Musculoskeletal:        General: Normal range of motion.  Skin:    General: Skin is warm and dry.     Findings: No erythema, lesion or rash.  Neurological:     Mental Status: She is alert and oriented to person, place, and time.  Psychiatric:        Behavior: Behavior normal.        Thought Content: Thought content normal.        Judgment: Judgment normal.        Assessment/Plan: 1. Essential hypertension (Primary) BP well controlled, may be able to cut amlodipine  in half if staying well controlled or dropping any further. She will monitor at home  2. Mixed hyperlipidemia Continue crestor    General Counseling: patches mcdonnell understanding of the findings of todays visit and agrees with plan of treatment. I have discussed any further diagnostic evaluation that may be needed or ordered today. We also reviewed her medications today. she has been encouraged to call the office with any questions or concerns that should arise related to todays visit.    No orders of the defined types were placed in this encounter.   No orders of  the defined types were placed in this encounter.   This patient was seen by Tinnie Pro, PA-C in collaboration with Dr. Sigrid Bathe as a part of collaborative care agreement.   Total time spent:30 Minutes Time spent includes review of chart, medications, test results, and follow up plan with the patient.      Dr Fozia M Khan Internal medicine

## 2023-10-29 DIAGNOSIS — R051 Acute cough: Secondary | ICD-10-CM | POA: Diagnosis not present

## 2023-10-29 DIAGNOSIS — J019 Acute sinusitis, unspecified: Secondary | ICD-10-CM | POA: Diagnosis not present

## 2023-10-29 DIAGNOSIS — J029 Acute pharyngitis, unspecified: Secondary | ICD-10-CM | POA: Diagnosis not present

## 2023-12-03 ENCOUNTER — Ambulatory Visit: Admitting: Physician Assistant

## 2023-12-03 VITALS — BP 134/84 | HR 84 | Temp 97.8°F | Resp 16 | Ht 59.0 in | Wt 184.0 lb

## 2023-12-03 DIAGNOSIS — R051 Acute cough: Secondary | ICD-10-CM

## 2023-12-03 DIAGNOSIS — J0110 Acute frontal sinusitis, unspecified: Secondary | ICD-10-CM

## 2023-12-03 MED ORDER — AMOXICILLIN-POT CLAVULANATE 875-125 MG PO TABS: 1.0000 | ORAL_TABLET | Freq: Two times a day (BID) | ORAL | 0 refills | Status: DC

## 2023-12-03 NOTE — Progress Notes (Signed)
 [facility] [address] [place], [place] [address]  Internal MEDICINE  Office Visit Note  Patient Name: Lauren Marsh  [id number]  [id number]  Date of Service: 12/03/2023  Chief Complaint  Patient presents with   Acute Visit   Sinusitis   Cough   Itchy Eye    red     HPI Pt is here for a sick visit. -Has been having URI symptoms for 1 month. Started on a Monday and went to walk in clinic on Thursday. -States covid, RSV, flu and strep negative at the time -was given promethazine-DM, cefdinir, and prednisone. States the cough syrup knocked her out -eyes started getting itchy, crusty and red just this week, started on right then left  -she has been using OPTASE dry eye drop from eye doctor that was given for dry eyes. Right improved with use, but left has been watery still -sinus pressure and congestion, coughing still. Got a little better initially until the last week.  -Did get some cough syrup that doesn't make her drowsy so she can use during daytime  Current Medication:  Outpatient Encounter Medications as of 12/03/2023  Medication Sig   acetaminophen  (TYLENOL ) 500 MG tablet Take by mouth.   amLODipine  (NORVASC ) 10 MG tablet TAKE 1 TABLET BY MOUTH EVERY DAY   ergocalciferol  (DRISDOL ) 1.25 MG (50000 UT) capsule Take one cap q week   pantoprazole  (PROTONIX ) 40 MG tablet TAKE 1 TABLET BY MOUTH EVERY DAY IN THE MORNING   rosuvastatin  (CRESTOR ) 5 MG tablet TAKE 1 TABLET BY MOUTH AT NIGHT TWICE PER WEEK   traZODone  (DESYREL ) 50 MG tablet TAKE 1 TABLET BY MOUTH EVERYDAY AT BEDTIME   TRI-LO-MARZIA  0.18/0.215/0.25 MG-25 MCG TABS Take 1 tablet by mouth daily. Use as directed for 28 days   No facility-administered encounter medications on file as of 12/03/2023.      Medical History: Past Medical History:  Diagnosis Date   GERD (gastroesophageal reflux disease)    Hypertension      Vital Signs: BP 134/84   Pulse 84   Temp 97.8 F (36.6 C)   Resp  16   Ht 4' 11 (1.499 m)   Wt 184 lb (83.5 kg)   SpO2 98%   BMI 37.16 kg/m    Review of Systems  Constitutional:  Negative for fatigue and fever.  HENT:  Positive for congestion, postnasal drip, sinus pressure, sinus pain and sneezing. Negative for mouth sores.   Eyes:  Positive for discharge, redness and itching.  Respiratory:  Positive for cough. Negative for shortness of breath and wheezing.   Cardiovascular:  Negative for chest pain.  Genitourinary:  Negative for flank pain.  Skin:  Negative for rash.  Psychiatric/Behavioral: Negative.      Physical Exam Vitals and nursing note reviewed.  Constitutional:      General: She is not in acute distress.    Appearance: She is well-developed. She is obese. She is not diaphoretic.  HENT:     Head: Normocephalic and atraumatic.  Eyes:     General:        Right eye: Discharge present.        Left eye: Discharge present.    Extraocular Movements: Extraocular movements intact.     Comments: Eyes red and watery  Neck:     Thyroid: No thyromegaly.     Vascular: No JVD.     Trachea: No tracheal deviation.  Cardiovascular:     Rate and Rhythm: Normal rate and regular  rhythm.     Heart sounds: Normal heart sounds. No murmur heard.    No friction rub. No gallop.  Pulmonary:     Effort: Pulmonary effort is normal. No respiratory distress.     Breath sounds: No wheezing or rales.  Chest:     Chest wall: No tenderness.  Breasts:    Right: Normal. No mass.     Left: Normal. No mass.  Skin:    General: Skin is warm and dry.     Findings: No erythema, lesion or rash.  Neurological:     Mental Status: She is alert and oriented to person, place, and time.  Psychiatric:        Behavior: Behavior normal.        Thought Content: Thought content normal.        Judgment: Judgment normal.       Assessment/Plan: 1. Acute non-recurrent frontal sinusitis (Primary) Will start augmentin  BID with food, may use cough syrup prn. Call if  not improving - amoxicillin -clavulanate (AUGMENTIN ) 875-125 MG tablet; Take 1 tablet by mouth 2 (two) times daily. Take with food.  Dispense: 20 tablet; Refill: 0  2. Acute cough Will start augmentin  for sinusitis which should help postnasal drip, may use cough syrup as needed. Call if not improving    General Counseling: jasmyn picha understanding of the findings of todays visit and agrees with plan of treatment. I have discussed any further diagnostic evaluation that may be needed or ordered today. We also reviewed her medications today. she has been encouraged to call the office with any questions or concerns that should arise related to todays visit.    Counseling:    No orders of the defined types were placed in this encounter.   No orders of the defined types were placed in this encounter.   Time spent:25 Minutes

## 2023-12-31 ENCOUNTER — Ambulatory Visit: Admitting: Physician Assistant

## 2024-01-04 ENCOUNTER — Encounter: Payer: Self-pay | Admitting: Physician Assistant

## 2024-01-04 ENCOUNTER — Ambulatory Visit: Admitting: Physician Assistant

## 2024-01-04 VITALS — BP 136/72 | HR 89 | Temp 98.0°F | Resp 16 | Ht 59.0 in | Wt 184.4 lb

## 2024-01-04 DIAGNOSIS — Z124 Encounter for screening for malignant neoplasm of cervix: Secondary | ICD-10-CM

## 2024-01-04 DIAGNOSIS — N760 Acute vaginitis: Secondary | ICD-10-CM

## 2024-01-04 DIAGNOSIS — Z113 Encounter for screening for infections with a predominantly sexual mode of transmission: Secondary | ICD-10-CM | POA: Diagnosis not present

## 2024-01-04 DIAGNOSIS — E782 Mixed hyperlipidemia: Secondary | ICD-10-CM | POA: Diagnosis not present

## 2024-01-04 DIAGNOSIS — Z1329 Encounter for screening for other suspected endocrine disorder: Secondary | ICD-10-CM

## 2024-01-04 DIAGNOSIS — I10 Essential (primary) hypertension: Secondary | ICD-10-CM | POA: Diagnosis not present

## 2024-01-04 DIAGNOSIS — R5383 Other fatigue: Secondary | ICD-10-CM

## 2024-01-04 DIAGNOSIS — E559 Vitamin D deficiency, unspecified: Secondary | ICD-10-CM

## 2024-01-04 MED ORDER — FLUCONAZOLE 150 MG PO TABS: 150.0000 mg | ORAL_TABLET | Freq: Once | ORAL | 0 refills | Status: AC

## 2024-01-04 NOTE — Progress Notes (Signed)
 [facility] [address] [place], [place] [address]  Internal MEDICINE  Office Visit Note  Patient Name: Lauren Marsh  [id number]  [id number]  Date of Service: 01/04/2024   Chief Complaint  Patient presents with   Follow-up   Gastroesophageal Reflux   Hypertension     HPI Pt is here for a pap smear -Some vaginal irritation and itchiness, thinks yeast infection. Tried monistat -trying to help with mom's appts due to memory decline. States mom missed mammogram and neurology appts. Will be rescheduling -BP stable  Current Medication: Outpatient Encounter Medications as of 01/04/2024  Medication Sig   acetaminophen  (TYLENOL ) 500 MG tablet Take by mouth.   amLODipine  (NORVASC ) 10 MG tablet TAKE 1 TABLET BY MOUTH EVERY DAY   ergocalciferol  (DRISDOL ) 1.25 MG (50000 UT) capsule Take one cap q week   fluconazole  (DIFLUCAN ) 150 MG tablet Take 1 tablet (150 mg total) by mouth once for 1 dose.   pantoprazole  (PROTONIX ) 40 MG tablet TAKE 1 TABLET BY MOUTH EVERY DAY IN THE MORNING   rosuvastatin  (CRESTOR ) 5 MG tablet TAKE 1 TABLET BY MOUTH AT NIGHT TWICE PER WEEK   TRI-LO-MARZIA  0.18/0.215/0.25 MG-25 MCG TABS Take 1 tablet by mouth daily. Use as directed for 28 days   [DISCONTINUED] amoxicillin -clavulanate (AUGMENTIN ) 875-125 MG tablet Take 1 tablet by mouth 2 (two) times daily. Take with food.   [DISCONTINUED] traZODone  (DESYREL ) 50 MG tablet TAKE 1 TABLET BY MOUTH EVERYDAY AT BEDTIME   No facility-administered encounter medications on file as of 01/04/2024.    Surgical History: Past Surgical History:  Procedure Laterality Date   ESOPHAGOGASTRODUODENOSCOPY (EGD) WITH PROPOFOL  N/A 07/24/2020   Procedure: ESOPHAGOGASTRODUODENOSCOPY (EGD) WITH PROPOFOL ;  Surgeon: Unk Corinn Skiff, MD;  Location: [facility] ENDOSCOPY;  Service: Gastroenterology;  Laterality: N/A;   EYE SURGERY      Medical History: Past Medical History:  Diagnosis Date   GERD (gastroesophageal  reflux disease)    Hypertension     Family History: Family History  Problem Relation Age of Onset   Hypertension Mother    Hypertension Father     Social History   Socioeconomic History   Marital status: Single    Spouse name: Not on file   Number of children: Not on file   Years of education: Not on file   Highest education level: Not on file  Occupational History   Not on file  Tobacco Use   Smoking status: Never   Smokeless tobacco: Never  Vaping Use   Vaping status: Never Used  Substance and Sexual Activity   Alcohol use: Yes    Alcohol/week: 1.0 standard drink of alcohol    Types: 1 Cans of beer per week    Comment: occ.   Drug use: Never   Sexual activity: Not on file  Other Topics Concern   Not on file  Social History Narrative   Not on file   Social Drivers of Health   Financial Resource Strain: Low Risk  (10/29/2023)   Received from [facility]   Overall Financial Resource Strain (CARDIA)    Difficulty of Paying Living Expenses: Not hard at all  Food Insecurity: No Food Insecurity (10/29/2023)   Received from [facility]   Hunger Vital Sign    Within the past 12 months, you worried that your food would run out before you got the money to buy more.: Never true    Within the past 12 months, the food you bought just didn't  last and you didn't have money to get more.: Never true  Transportation Needs: No Transportation Needs (10/29/2023)   Received from [facility] - Transportation    In the past 12 months, has lack of transportation kept you from medical appointments or from getting medications?: No    Lack of Transportation (Non-Medical): No  Physical Activity: Not on file  Stress: Not on file  Social Connections: Not on file  Intimate Partner Violence: Not on file     Review of Systems  Constitutional:  Negative for chills, fatigue and unexpected weight change.  HENT:  Negative for  congestion, rhinorrhea, sneezing and sore throat.   Eyes:  Negative for redness.  Respiratory:  Negative for cough, chest tightness and shortness of breath.   Cardiovascular:  Negative for chest pain and palpitations.  Gastrointestinal:  Negative for abdominal pain, constipation, diarrhea, nausea and vomiting.  Genitourinary:  Positive for vaginal discharge. Negative for dysuria and frequency.  Musculoskeletal:  Negative for arthralgias, back pain, joint swelling and neck pain.  Skin:  Negative for rash.  Neurological: Negative.  Negative for tremors and numbness.  Hematological:  Negative for adenopathy. Does not bruise/bleed easily.  Psychiatric/Behavioral:  Negative for behavioral problems (Depression), sleep disturbance and suicidal ideas. The patient is not nervous/anxious.      Vital signs: BP 136/72 Comment: 140/65  Pulse 89   Temp 98 F (36.7 C)   Resp 16   Ht 4' 11 (1.499 m)   Wt 184 lb 6.4 oz (83.6 kg)   SpO2 98%   BMI 37.24 kg/m    Physical Exam Vitals and nursing note reviewed. Exam conducted with a chaperone present.  Constitutional:      General: She is not in acute distress.    Appearance: She is well-developed. She is obese. She is not diaphoretic.  HENT:     Head: Normocephalic and atraumatic.  Eyes:     Extraocular Movements: Extraocular movements intact.  Neck:     Thyroid: No thyromegaly.     Vascular: No JVD.     Trachea: No tracheal deviation.  Cardiovascular:     Rate and Rhythm: Normal rate and regular rhythm.     Heart sounds: Normal heart sounds. No murmur heard.    No friction rub. No gallop.  Pulmonary:     Effort: Pulmonary effort is normal. No respiratory distress.     Breath sounds: No wheezing or rales.  Chest:     Chest wall: No tenderness.  Breasts:    Right: Normal. No mass.     Left: Normal. No mass.  Genitourinary:    Exam position: Lithotomy position.     Vagina: Vaginal discharge present.     Comments: Pap  performed Musculoskeletal:        General: Normal range of motion.  Skin:    General: Skin is warm and dry.     Findings: No erythema, lesion or rash.  Neurological:     Mental Status: She is alert and oriented to person, place, and time.  Psychiatric:        Behavior: Behavior normal.        Thought Content: Thought content normal.        Judgment: Judgment normal.       Assessment/Plan: 1. Essential hypertension (Primary) Stable, continue current medication  2. Routine cervical smear - IGP, Aptima HPV  3. Vitamin D  deficiency - VITAMIN D  25 Hydroxy (Vit-D Deficiency, Fractures)  4. Mixed hyperlipidemia -  Lipid Panel With LDL/HDL Ratio  5. Thyroid disorder screen - TSH + free T4  6. Acute vaginitis Given diflucan  and will check swab - NuSwab Vaginitis Plus (VG+)  7. Other fatigue - CBC w/Diff/Platelet - Comprehensive metabolic panel with GFR - TSH + free T4 - Lipid Panel With LDL/HDL Ratio    General Counseling: caelin rosen understanding of the findings of todays visit and agrees with plan of treatment. I have discussed any further diagnostic evaluation that may be needed or ordered today. We also reviewed her medications today. she has been encouraged to call the office with any questions or concerns that should arise related to todays visit.    Counseling:    Orders Placed This Encounter  Procedures   CBC w/Diff/Platelet   Comprehensive metabolic panel with GFR   TSH + free T4   Lipid Panel With LDL/HDL Ratio   VITAMIN D  25 Hydroxy (Vit-D Deficiency, Fractures)   NuSwab Vaginitis Plus (VG+)    Meds ordered this encounter  Medications   fluconazole  (DIFLUCAN ) 150 MG tablet    Sig: Take 1 tablet (150 mg total) by mouth once for 1 dose.    Dispense:  1 tablet    Refill:  0    Time spent:30 Minutes

## 2024-01-06 ENCOUNTER — Other Ambulatory Visit: Payer: Self-pay

## 2024-01-06 ENCOUNTER — Telehealth: Payer: Self-pay

## 2024-01-06 LAB — IGP, APTIMA HPV: HPV Aptima: NEGATIVE

## 2024-01-06 MED ORDER — NITROFURANTOIN MONOHYD MACRO 100 MG PO CAPS: 100.0000 mg | ORAL_CAPSULE | Freq: Two times a day (BID) | ORAL | 0 refills | Status: AC

## 2024-01-06 NOTE — Telephone Encounter (Signed)
 Pt called that she is having UTI symptoms as per dr fernand sent Macrobid  and advised pt if not feeling better need appt

## 2024-01-07 LAB — NUSWAB VAGINITIS PLUS (VG+)
Chlamydia trachomatis, NAA: NEGATIVE
Neisseria gonorrhoeae, NAA: NEGATIVE
Trich vag by NAA: NEGATIVE

## 2024-01-11 ENCOUNTER — Ambulatory Visit: Payer: Self-pay | Admitting: Physician Assistant

## 2024-01-12 NOTE — Telephone Encounter (Signed)
 Pt notified via MyChart.

## 2024-01-12 NOTE — Telephone Encounter (Signed)
-----   Message from Tinnie MARLA Pro sent at 01/11/2024  4:25 PM EST ----- Please let her know that pap was normal and HPV negative, swab also negative ----- Message ----- From: Interface, [facility] Lab Results In Sent: 01/06/2024   7:37 AM EST To: Tinnie MARLA Pro, PA-C

## 2024-02-18 ENCOUNTER — Other Ambulatory Visit: Payer: Self-pay | Admitting: Physician Assistant

## 2024-02-18 DIAGNOSIS — Z3041 Encounter for surveillance of contraceptive pills: Secondary | ICD-10-CM

## 2024-02-24 ENCOUNTER — Other Ambulatory Visit: Payer: Self-pay | Admitting: Physician Assistant

## 2024-02-24 DIAGNOSIS — I10 Essential (primary) hypertension: Secondary | ICD-10-CM

## 2024-02-24 DIAGNOSIS — K219 Gastro-esophageal reflux disease without esophagitis: Secondary | ICD-10-CM

## 2024-05-02 ENCOUNTER — Encounter: Admitting: Physician Assistant
